# Patient Record
Sex: Male | Born: 1950 | Race: White | Hispanic: No | Marital: Single | Smoking: Former smoker
Health system: Southern US, Community
[De-identification: ages and names within clinical notes are randomized; demographics above are authoritative.]

## PROBLEM LIST (undated history)

## (undated) DIAGNOSIS — I10 Essential (primary) hypertension: Secondary | ICD-10-CM

## (undated) DIAGNOSIS — E119 Type 2 diabetes mellitus without complications: Secondary | ICD-10-CM

## (undated) DIAGNOSIS — R748 Abnormal levels of other serum enzymes: Principal | ICD-10-CM

## (undated) DIAGNOSIS — K562 Volvulus: Secondary | ICD-10-CM

## (undated) DIAGNOSIS — E785 Hyperlipidemia, unspecified: Secondary | ICD-10-CM

## (undated) DIAGNOSIS — Z87442 Personal history of urinary calculi: Secondary | ICD-10-CM

## (undated) DIAGNOSIS — J302 Other seasonal allergic rhinitis: Secondary | ICD-10-CM

## (undated) DIAGNOSIS — C801 Malignant (primary) neoplasm, unspecified: Secondary | ICD-10-CM

## (undated) HISTORY — DX: Hyperlipidemia, unspecified: E78.5

## (undated) HISTORY — DX: Other seasonal allergic rhinitis: J30.2

## (undated) HISTORY — DX: Type 2 diabetes mellitus without complications: E11.9

## (undated) HISTORY — PX: KIDNEY STONE SURGERY: SHX686

## (undated) HISTORY — DX: Abnormal levels of other serum enzymes: R74.8

## (undated) HISTORY — DX: Essential (primary) hypertension: I10

## (undated) HISTORY — DX: Malignant (primary) neoplasm, unspecified: C80.1

---
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
## 2001-12-15 ENCOUNTER — Emergency Department (HOSPITAL_COMMUNITY): Admission: EM | Admit: 2001-12-15 | Discharge: 2001-12-16 | Payer: Self-pay | Admitting: Emergency Medicine

## 2001-12-16 ENCOUNTER — Encounter: Payer: Self-pay | Admitting: Emergency Medicine

## 2003-08-11 ENCOUNTER — Emergency Department (HOSPITAL_COMMUNITY): Admission: EM | Admit: 2003-08-11 | Discharge: 2003-08-11 | Payer: Self-pay | Admitting: *Deleted

## 2003-08-19 ENCOUNTER — Ambulatory Visit (HOSPITAL_BASED_OUTPATIENT_CLINIC_OR_DEPARTMENT_OTHER): Admission: RE | Admit: 2003-08-19 | Discharge: 2003-08-19 | Payer: Self-pay | Admitting: Urology

## 2003-08-19 ENCOUNTER — Ambulatory Visit (HOSPITAL_COMMUNITY): Admission: RE | Admit: 2003-08-19 | Discharge: 2003-08-19 | Payer: Self-pay | Admitting: Urology

## 2008-03-18 ENCOUNTER — Ambulatory Visit: Admission: RE | Admit: 2008-03-18 | Discharge: 2008-04-21 | Payer: Self-pay | Admitting: Radiation Oncology

## 2008-04-24 DIAGNOSIS — C801 Malignant (primary) neoplasm, unspecified: Secondary | ICD-10-CM

## 2008-04-24 HISTORY — DX: Malignant (primary) neoplasm, unspecified: C80.1

## 2008-04-24 HISTORY — PX: PROSTATECTOMY: SHX69

## 2008-06-22 ENCOUNTER — Inpatient Hospital Stay (HOSPITAL_COMMUNITY): Admission: RE | Admit: 2008-06-22 | Discharge: 2008-06-23 | Payer: Self-pay | Admitting: Urology

## 2008-06-22 ENCOUNTER — Encounter (INDEPENDENT_AMBULATORY_CARE_PROVIDER_SITE_OTHER): Payer: Self-pay | Admitting: Urology

## 2010-06-06 ENCOUNTER — Encounter: Payer: Self-pay | Admitting: Internal Medicine

## 2010-08-04 LAB — HEMOGLOBIN AND HEMATOCRIT, BLOOD
HCT: 37.7 % — ABNORMAL LOW (ref 39.0–52.0)
HCT: 41.4 % (ref 39.0–52.0)
Hemoglobin: 13.2 g/dL (ref 13.0–17.0)
Hemoglobin: 14 g/dL (ref 13.0–17.0)

## 2010-08-04 LAB — TYPE AND SCREEN
ABO/RH(D): A POS
Antibody Screen: NEGATIVE

## 2010-08-09 LAB — BASIC METABOLIC PANEL
CO2: 28 mEq/L (ref 19–32)
Calcium: 9.5 mg/dL (ref 8.4–10.5)
Creatinine, Ser: 0.92 mg/dL (ref 0.4–1.5)
GFR calc Af Amer: >60 mL/min (ref >60)
GFR calc non Af Amer: >60 mL/min (ref >60)
Sodium: 137 mEq/L (ref 135–145)

## 2010-08-09 LAB — CBC
Hemoglobin: 14.4 g/dL (ref 13.0–17.0)
MCHC: 33.4 g/dL (ref 30.0–36.0)
RBC: 4.79 MIL/uL (ref 4.22–5.81)
WBC: 5.5 10*3/uL (ref 4.0–10.5)

## 2010-09-06 NOTE — Op Note (Signed)
NAMEJERRICO, COVELLO                 ACCOUNT NO.:  [id number]   MEDICAL RECORD NO.:  [id number]          PATIENT TYPE:  INP   LOCATION:  1442                         FACILITY:  [facility]   PHYSICIAN:  Heloise Purpura, MD      DATE OF BIRTH:  [birth year]   DATE OF PROCEDURE:  06/22/2008  DATE OF DISCHARGE:                               OPERATIVE REPORT   PREOPERATIVE DIAGNOSIS:  Clinically localized adenocarcinoma of prostate  (clinical stage T2A, NX, MX).   POSTOPERATIVE DIAGNOSIS:  Clinically localized adenocarcinoma of  prostate (clinical stage T2A, NX, MX).   PROCEDURE:  Robotic assisted laparoscopic radical prostatectomy  (bilateral nerve sparing).   SURGEON:  Heloise Purpura, MD   ASSISTANT:  Delia Chimes, nurse practitioner.   ANESTHESIA:  General.   COMPLICATIONS:  None.   ESTIMATED BLOOD LOSS:  100 mL.   SPECIMENS:  Prostate and seminal vesicles.   DISPOSITION OF SPECIMENS:  To pathology.   DRAINS:  1. A 20-French coude catheter.  2. #19 Blake pelvic drain.   INDICATION:  Mr. Salido is a 60 year old gentleman with clinically  localized adenocarcinoma of the prostate.  After a discussion regarding  management options for treatment, he elected to proceed with surgical  therapy and the above procedure.  The potential risks, complications,  and alternative treatment options were discussed in detail and informed  consent was obtained.   DESCRIPTION OF PROCEDURE:  The patient was taken to the operating room  and a general anesthetic was administered.  He was given preoperative  antibiotics, placed in the dorsal lithotomy position, and prepped and  draped in the usual sterile fashion.  Next preoperative time-out was  performed.  A Foley catheter was inserted into the bladder and a site  was selected just to the left of the umbilicus for placement of the  camera port.  This was placed using a standard open Hasson technique  which allowed entry into the peritoneal cavity under  direct vision  without difficulty.  A 12 mm port was then placed and a pneumoperitoneum  established.  The 0 degrees lens was used to inspect the abdomen and  there was no evidence for any intra-abdominal injuries or other  abnormalities.  The remaining ports were then placed.  8 mm robotic  ports were placed 10 cm lateral to and just inferior to the camera port  site bilaterally.  An additional 8 mm robotic port was placed in the far  left lateral abdominal wall.  A 5 mm port was placed between the camera  port and the right robotic port.  An additional 12 mm port was placed in  the far right lateral abdominal wall for laparoscopic assistance.  All  ports were placed under direct vision without difficulty.  The surgical  cart was then docked.  With the aid of the cautery scissors, the bladder  was reflected posteriorly allowing entry into space of Retzius and  identification of the endopelvic fascia and prostate.  The endopelvic  fascia was then incised from the apex back to the base of the prostate  bilaterally and the  underlying levator muscle fibers were swept  laterally off the prostate thereby isolating the dorsal venous complex.  The dorsal vein was then stapled and divided with a 45 mm Flex ETS  stapler.  The bladder neck was identified with the aid of Foley catheter  manipulation and divided anteriorly thereby exposing the Foley catheter.  The Foley catheter balloon was deflated and the catheter was brought  into the operative field and used to retract the prostate anteriorly.  The posterior bladder neck was examined and was divided.  Dissection  proceeded between the bladder and prostate until the vasa deferentia and  seminal vesicles were identified.  The vasa deferentia were isolated,  divided and lifted anteriorly.  The seminal vesicles were then dissected  down to their tips with care to control the seminal vesicle arterial  blood supply.  The seminal vesicles were then  lifted anteriorly and the  space between Denonvilliers fascia and the anterior rectum was bluntly  developed thereby isolating the vascular pedicles of the prostate.  The  lateral prostatic fascia was incised bilaterally and the neurovascular  bundles were released.  The vascular pedicles of the prostate were then  ligated with Hem-o-lok clips above the level of the neurovascular  bundles and this tissue was divided with sharp cold scissor dissection,  allowing the neurovascular bundles to be swept off the apex of the  prostate and urethra.  Urethra was then sharply divided allowing the  specimen to be disarticulated.  The pelvis was copiously irrigated and  hemostasis was ensured.  Attention then turned to the urethral  anastomosis.  A  2-0 Vicryl slip-knot was placed between Denonvilliers  fascia, the posterior bladder neck, and the posterior urethra to  reapproximate these structures.  A double-armed 3-0 Monocryl suture was  then used to perform a 360 degrees running tension-free anastomosis  between the bladder neck and urethra.  A new 20-French coude catheter  was inserted into the bladder and irrigated.  There were no blood clots  within the bladder and anastomosis appeared to be watertight.  A  #19  Blake pelvic drain was then brought through the left robotic port and  appropriately positioned in the pelvis.  It was secured to the skin with  a nylon suture.  The surgical cart was then undocked.  The right lateral  12 mm port site was closed with a 0 Vicryl suture placed with the aid of  the suture passer device.  The prostate specimen was then removed intact  within the Endopouch retrieval bag via the periumbilical port site.  This port site was then closed with a running 0 Vicryl suture.  All port  sites were injected with 0.25% Marcaine in no acute distress  reapproximated at the skin level with staples.  Sterile dressings were  applied.  The patient appeared to tolerate the  procedure well without  complications.  He was able to be extubated and transferred to recovery  in satisfactory condition.      Heloise Purpura, MD  Electronically Signed     LB/MEDQ  D:  06/22/2008  T:  06/22/2008  Job:  [id number]

## 2010-09-09 NOTE — Op Note (Signed)
NAME:  Donald Hanson, Donald Hanson                           ACCOUNT NO.:  [id number]   MEDICAL RECORD NO.:  [id number]                   PATIENT TYPE:  AMB   LOCATION:  [facility]                                 FACILITY:  [facility]   PHYSICIAN:  Excell Seltzer. Annabell Howells, M.D.                 DATE OF BIRTH:  1950/04/30   DATE OF PROCEDURE:  08/19/2003  DATE OF DISCHARGE:                                 OPERATIVE REPORT   OPERATION/PROCEDURE:  1. Cystoscopy.  2. Right ureteroscopic stone extraction.   PREOPERATIVE DIAGNOSIS:  Right distal ureteral stone.   POSTOPERATIVE DIAGNOSIS:  Right distal ureteral stone.   SURGEON:  Excell Seltzer. Annabell Howells, M.D.   ANESTHESIA:  General.   SPECIMENS:  Stone fragments.   COMPLICATIONS:  None.   INDICATIONS:  [name] is a 60 year old white male with a symptomatic 5 mm  right distal ureteral stone who has elected ureteroscopic stone extraction.   FINDINGS AND PROCEDURE:  The patient was given p.o. Cipro.  He was taken to  the operating room where general anesthesia was induced.  He was placed in  the lithotomy position.  A B&O suppository was placed.  His genitalia was  prepped with Betadine solution and he was draped in the usual sterile  fashion.  Cystoscopy was performed using the 22-French scope and the 12- and  70-degree lenses.  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  It was  easily passed.  The external sphincter was otherwise intact.  The prostatic  urethra had trilobar hyperplasia with mild obstruction.  Examination of the  bladder revealed mild trabeculation.  No tumor, stones or inflammation were  noted.  Ureteral orifices were unremarkable with the exception of the stone  being visible through the right ureteral orifice.  The cystoscope was  removed.  A 6-French short ureteroscope was then passed.  A guidewire was  passed through the ureteroscope up the right ureteral orifice.  The  scope  was then advanced up the orifice and the wire was removed.  The stone was  visualized, grasped with a nitinol basket and removed.  Initially a small  fragment came out.  I looked back and got the body of the stone.  At this  point the ureteroscope was removed.  The cystoscope was replaced.  The  guidewire was positioned in the kidney under fluoroscopic guidance and a 6-  French x 24 cm double-J stent with string was placed without difficulty.  The wire was removed leaving good coil in the kidney and good coil in the  bladder.  The bladder was drained and the scope was removed leaving the  stent string exiting the urethra.  Final position check with fluoroscopy was  good.  At this point the patient was taken down from the lithotomy position,  his anesthetic was reversed.  He was  admitted to the recovery room in stable  condition.  There were no complications.                                               Excell Seltzer. Annabell Howells, M.D.    JJW/MEDQ  D:  08/19/2003  T:  08/19/2003  Job:  [id number]

## 2010-10-17 NOTE — Letter (Signed)
Summary: Colonoscopy Date Change Letter  [facility]  [address]   [place], [place] [address]   Phone: [phone]  Fax: [phone]      June 06, 2010 MRN: [id number]   Donald Hanson [address] [place], [place]  [address]   Dear [name],   Previously you were recommended to have a repeat colonoscopy around this time. Your chart was recently reviewed by Dr. Leone Payor of [facility]. Follow up colonoscopy is now recommended in February 2015. This revised recommendation is based on current, nationally recognized guidelines for colorectal cancer screening and polyp surveillance. These guidelines are endorsed by the American Cancer Society, [employer] on Colorectal Cancer as well as numerous other major medical organizations.  Please understand that our recommendation assumes that you do not have any new symptoms such as bleeding, a change in bowel habits, anemia, or significant abdominal discomfort. If you do have any concerning GI symptoms or want to discuss the guideline recommendations, please call to arrange an office visit at your earliest convenience. Otherwise we will keep you in our reminder system and contact you 1-2 months prior to the date listed above to schedule your next colonoscopy.  Thank you,   Stan Head, M.D.  [facility] [phone]

## 2013-05-26 ENCOUNTER — Encounter: Payer: Self-pay | Admitting: Internal Medicine

## 2013-05-30 ENCOUNTER — Ambulatory Visit (AMBULATORY_SURGERY_CENTER): Payer: Self-pay | Admitting: *Deleted

## 2013-05-30 VITALS — Ht 70.0 in | Wt 214.0 lb

## 2013-05-30 DIAGNOSIS — Z1211 Encounter for screening for malignant neoplasm of colon: Secondary | ICD-10-CM

## 2013-05-30 MED ORDER — NA SULFATE-K SULFATE-MG SULF 17.5-3.13-1.6 GM/177ML PO SOLN: 1.0000 | Freq: Once | ORAL | Status: DC

## 2013-05-30 NOTE — Progress Notes (Signed)
No allergies to eggs or soy. No problems with anesthesia.  

## 2013-06-04 ENCOUNTER — Encounter: Payer: Self-pay | Admitting: Internal Medicine

## 2013-06-09 ENCOUNTER — Telehealth: Payer: Self-pay | Admitting: Internal Medicine

## 2013-06-10 NOTE — Telephone Encounter (Signed)
No charge. 

## 2013-06-11 ENCOUNTER — Encounter: Payer: Self-pay | Admitting: Internal Medicine

## 2013-06-27 ENCOUNTER — Emergency Department (HOSPITAL_COMMUNITY): Payer: 59

## 2013-06-27 ENCOUNTER — Encounter (HOSPITAL_COMMUNITY)
Admission: EM | Disposition: A | Discharge status: Home / Self Care | Payer: 59 | Source: Home / Self Care | Attending: Internal Medicine

## 2013-06-27 ENCOUNTER — Inpatient Hospital Stay (HOSPITAL_COMMUNITY)
Admission: EM | Admit: 2013-06-27 | Discharge: 2013-06-30 | DRG: 390 | Disposition: A | Discharge status: Home / Self Care | Payer: 59 | Attending: Internal Medicine | Admitting: Internal Medicine

## 2013-06-27 ENCOUNTER — Observation Stay (HOSPITAL_COMMUNITY): Payer: 59

## 2013-06-27 ENCOUNTER — Encounter (HOSPITAL_COMMUNITY): Payer: Self-pay | Admitting: Internal Medicine

## 2013-06-27 ENCOUNTER — Inpatient Hospital Stay (HOSPITAL_COMMUNITY): Payer: 59

## 2013-06-27 DIAGNOSIS — E1165 Type 2 diabetes mellitus with hyperglycemia: Secondary | ICD-10-CM | POA: Diagnosis present

## 2013-06-27 DIAGNOSIS — IMO0002 Reserved for concepts with insufficient information to code with codable children: Secondary | ICD-10-CM | POA: Diagnosis present

## 2013-06-27 DIAGNOSIS — R109 Unspecified abdominal pain: Secondary | ICD-10-CM | POA: Insufficient documentation

## 2013-06-27 DIAGNOSIS — E118 Type 2 diabetes mellitus with unspecified complications: Secondary | ICD-10-CM

## 2013-06-27 DIAGNOSIS — Z8546 Personal history of malignant neoplasm of prostate: Secondary | ICD-10-CM

## 2013-06-27 DIAGNOSIS — K562 Volvulus: Principal | ICD-10-CM | POA: Diagnosis present

## 2013-06-27 DIAGNOSIS — IMO0001 Reserved for inherently not codable concepts without codable children: Secondary | ICD-10-CM | POA: Diagnosis present

## 2013-06-27 DIAGNOSIS — I10 Essential (primary) hypertension: Secondary | ICD-10-CM | POA: Diagnosis present

## 2013-06-27 DIAGNOSIS — K7689 Other specified diseases of liver: Secondary | ICD-10-CM | POA: Diagnosis present

## 2013-06-27 DIAGNOSIS — R933 Abnormal findings on diagnostic imaging of other parts of digestive tract: Secondary | ICD-10-CM

## 2013-06-27 DIAGNOSIS — Z87891 Personal history of nicotine dependence: Secondary | ICD-10-CM

## 2013-06-27 DIAGNOSIS — E785 Hyperlipidemia, unspecified: Secondary | ICD-10-CM | POA: Diagnosis present

## 2013-06-27 DIAGNOSIS — Z79899 Other long term (current) drug therapy: Secondary | ICD-10-CM

## 2013-06-27 DIAGNOSIS — R040 Epistaxis: Secondary | ICD-10-CM | POA: Diagnosis not present

## 2013-06-27 HISTORY — DX: Personal history of urinary calculi: Z87.442

## 2013-06-27 HISTORY — PX: COLONOSCOPY: SHX5424

## 2013-06-27 LAB — COMPREHENSIVE METABOLIC PANEL
ALBUMIN: 4.3 g/dL (ref 3.5–5.2)
ALT: 56 U/L — ABNORMAL HIGH (ref 0–53)
AST: 41 U/L — AB (ref 0–37)
Alkaline Phosphatase: 59 U/L (ref 39–117)
BILIRUBIN TOTAL: 0.6 mg/dL (ref 0.3–1.2)
BUN: 18 mg/dL (ref 6–23)
CHLORIDE: 102 meq/L (ref 96–112)
CO2: 24 mEq/L (ref 19–32)
CREATININE: 1.01 mg/dL (ref 0.50–1.35)
Calcium: 10.1 mg/dL (ref 8.4–10.5)
GFR calc Af Amer: >90 mL/min (ref >90)
GFR calc non Af Amer: 78 mL/min — ABNORMAL LOW (ref >90)
Glucose, Bld: 157 mg/dL — ABNORMAL HIGH (ref 70–99)
Potassium: 4.5 mEq/L (ref 3.7–5.3)
Sodium: 139 mEq/L (ref 137–147)
TOTAL PROTEIN: 6.7 g/dL (ref 6.0–8.3)

## 2013-06-27 LAB — CBC
HEMATOCRIT: 43.6 % (ref 39.0–52.0)
Hemoglobin: 14.9 g/dL (ref 13.0–17.0)
MCH: 29.9 pg (ref 26.0–34.0)
MCHC: 34.2 g/dL (ref 30.0–36.0)
MCV: 87.4 fL (ref 78.0–100.0)
PLATELETS: 230 10*3/uL (ref 150–400)
RBC: 4.99 MIL/uL (ref 4.22–5.81)
RDW: 13.2 % (ref 11.5–15.5)
WBC: 8.6 10*3/uL (ref 4.0–10.5)

## 2013-06-27 LAB — GLUCOSE, CAPILLARY
Glucose-Capillary: 126 mg/dL — ABNORMAL HIGH (ref 70–99)
Glucose-Capillary: 96 mg/dL (ref 70–99)
Glucose-Capillary: 115 mg/dL — ABNORMAL HIGH (ref 70–99)

## 2013-06-27 LAB — I-STAT CG4 LACTIC ACID, ED: LACTIC ACID, VENOUS: 0.87 mmol/L (ref 0.5–2.2)

## 2013-06-27 LAB — URINALYSIS, ROUTINE W REFLEX MICROSCOPIC
Bilirubin Urine: NEGATIVE
Glucose, UA: 100 mg/dL — AB
HGB URINE DIPSTICK: NEGATIVE
Ketones, ur: NEGATIVE mg/dL
LEUKOCYTES UA: NEGATIVE
NITRITE: NEGATIVE
Protein, ur: NEGATIVE mg/dL
SPECIFIC GRAVITY, URINE: 1.025 (ref 1.005–1.030)
UROBILINOGEN UA: 0.2 mg/dL (ref 0.0–1.0)
pH: 5.5 (ref 5.0–8.0)

## 2013-06-27 LAB — CREATININE, SERUM
Creatinine, Ser: 0.97 mg/dL (ref 0.50–1.35)
GFR calc non Af Amer: 87 mL/min — ABNORMAL LOW (ref >90)

## 2013-06-27 LAB — CBC WITH DIFFERENTIAL/PLATELET
BASOS ABS: 0 10*3/uL (ref 0.0–0.1)
BASOS PCT: 1 % (ref 0–1)
Eosinophils Absolute: 0.2 10*3/uL (ref 0.0–0.7)
Eosinophils Relative: 3 % (ref 0–5)
HEMATOCRIT: 41.9 % (ref 39.0–52.0)
Hemoglobin: 14.5 g/dL (ref 13.0–17.0)
Lymphocytes Relative: 30 % (ref 12–46)
Lymphs Abs: 1.8 10*3/uL (ref 0.7–4.0)
MCH: 30 pg (ref 26.0–34.0)
MCHC: 34.6 g/dL (ref 30.0–36.0)
MCV: 86.7 fL (ref 78.0–100.0)
MONO ABS: 1 10*3/uL (ref 0.1–1.0)
Monocytes Relative: 17 % — ABNORMAL HIGH (ref 3–12)
NEUTROS ABS: 3 10*3/uL (ref 1.7–7.7)
NEUTROS PCT: 49 % (ref 43–77)
Platelets: 245 10*3/uL (ref 150–400)
RBC: 4.83 MIL/uL (ref 4.22–5.81)
RDW: 13.1 % (ref 11.5–15.5)
WBC: 6.1 10*3/uL (ref 4.0–10.5)

## 2013-06-27 LAB — LACTATE DEHYDROGENASE: LDH: 204 U/L (ref 94–250)

## 2013-06-27 LAB — LIPASE, BLOOD: Lipase: 263 U/L — ABNORMAL HIGH (ref 11–59)

## 2013-06-27 LAB — I-STAT TROPONIN, ED: TROPONIN I, POC: 0 ng/mL (ref 0.00–0.08)

## 2013-06-27 LAB — HEMOGLOBIN A1C
Hgb A1c MFr Bld: 7.7 % — ABNORMAL HIGH (ref <5.7)
Mean Plasma Glucose: 174 mg/dL — ABNORMAL HIGH (ref <117)

## 2013-06-27 SURGERY — COLONOSCOPY
Anesthesia: Moderate Sedation

## 2013-06-27 MED ORDER — MIDAZOLAM HCL 5 MG/5ML IJ SOLN
INTRAMUSCULAR | Status: DC | PRN
  Administered 2013-06-27: 2 mg via INTRAVENOUS
  Administered 2013-06-27: 1 mg via INTRAVENOUS
  Administered 2013-06-27: 2 mg via INTRAVENOUS

## 2013-06-27 MED ORDER — IOHEXOL 300 MG/ML  SOLN
100.0000 mL | Freq: Once | INTRAMUSCULAR | Status: AC | PRN
  Administered 2013-06-27: 100 mL via INTRAVENOUS

## 2013-06-27 MED ORDER — ONDANSETRON HCL 4 MG PO TABS
4.0000 mg | ORAL_TABLET | Freq: Four times a day (QID) | ORAL | Status: DC | PRN
Start: 2013-06-27 — End: 2013-06-30

## 2013-06-27 MED ORDER — SODIUM CHLORIDE 0.9 % IV SOLN: INTRAVENOUS | Status: DC

## 2013-06-27 MED ORDER — HEPARIN SODIUM (PORCINE) 5000 UNIT/ML IJ SOLN
5000.0000 [IU] | Freq: Three times a day (TID) | INTRAMUSCULAR | Status: DC
  Administered 2013-06-28: 5000 [IU] via SUBCUTANEOUS
  Filled 2013-06-27 (×12): qty 1

## 2013-06-27 MED ORDER — MIDAZOLAM HCL 5 MG/ML IJ SOLN
INTRAMUSCULAR | Status: AC
  Filled 2013-06-27: qty 2

## 2013-06-27 MED ORDER — INSULIN ASPART 100 UNIT/ML ~~LOC~~ SOLN
0.0000 [IU] | Freq: Three times a day (TID) | SUBCUTANEOUS | Status: DC
  Administered 2013-06-28 – 2013-06-29 (×2): 1 [IU] via SUBCUTANEOUS
  Administered 2013-06-29: 2 [IU] via SUBCUTANEOUS
  Administered 2013-06-30: 1 [IU] via SUBCUTANEOUS

## 2013-06-27 MED ORDER — FENTANYL CITRATE 0.05 MG/ML IJ SOLN
INTRAMUSCULAR | Status: DC | PRN
  Administered 2013-06-27 (×3): 25 ug via INTRAVENOUS

## 2013-06-27 MED ORDER — HYDROMORPHONE HCL PF 1 MG/ML IJ SOLN
1.0000 mg | Freq: Once | INTRAMUSCULAR | Status: AC
  Administered 2013-06-27: 1 mg via INTRAVENOUS
  Filled 2013-06-27: qty 1

## 2013-06-27 MED ORDER — ACETAMINOPHEN 650 MG RE SUPP: 650.0000 mg | Freq: Four times a day (QID) | RECTAL | Status: DC | PRN

## 2013-06-27 MED ORDER — ONDANSETRON HCL 4 MG/2ML IJ SOLN
4.0000 mg | Freq: Once | INTRAMUSCULAR | Status: AC
  Administered 2013-06-27: 4 mg via INTRAVENOUS
  Filled 2013-06-27: qty 2

## 2013-06-27 MED ORDER — HYDROMORPHONE HCL PF 1 MG/ML IJ SOLN
1.0000 mg | INTRAMUSCULAR | Status: DC | PRN
  Administered 2013-06-27 – 2013-06-28 (×5): 1 mg via INTRAVENOUS
  Filled 2013-06-27 (×5): qty 1

## 2013-06-27 MED ORDER — ONDANSETRON HCL 4 MG/2ML IJ SOLN: 4.0000 mg | Freq: Four times a day (QID) | INTRAMUSCULAR | Status: DC | PRN

## 2013-06-27 MED ORDER — IOHEXOL 300 MG/ML  SOLN
450.0000 mL | Freq: Once | INTRAMUSCULAR | Status: AC | PRN
  Administered 2013-06-27: 450 mL

## 2013-06-27 MED ORDER — FENTANYL CITRATE 0.05 MG/ML IJ SOLN
INTRAMUSCULAR | Status: AC
  Filled 2013-06-27: qty 2

## 2013-06-27 MED ORDER — HYDRALAZINE HCL 20 MG/ML IJ SOLN: 5.0000 mg | Freq: Four times a day (QID) | INTRAMUSCULAR | Status: DC | PRN

## 2013-06-27 MED ORDER — ACETAMINOPHEN 325 MG PO TABS
650.0000 mg | ORAL_TABLET | Freq: Four times a day (QID) | ORAL | Status: DC | PRN
  Administered 2013-06-27: 650 mg via ORAL
  Filled 2013-06-27: qty 2

## 2013-06-27 MED ORDER — POTASSIUM CHLORIDE IN NACL 20-0.45 MEQ/L-% IV SOLN
INTRAVENOUS | Status: DC
  Administered 2013-06-27 – 2013-06-29 (×4): via INTRAVENOUS
  Filled 2013-06-27 (×6): qty 1000

## 2013-06-27 MED ORDER — INSULIN DETEMIR 100 UNIT/ML ~~LOC~~ SOLN
10.0000 [IU] | Freq: Every day | SUBCUTANEOUS | Status: DC
  Administered 2013-06-28 – 2013-06-29 (×2): 10 [IU] via SUBCUTANEOUS
  Filled 2013-06-27 (×4): qty 0.1

## 2013-06-27 NOTE — ED Notes (Signed)
Updated family waiting on bed placement

## 2013-06-27 NOTE — Progress Notes (Signed)
Donald Hanson [id number] Code Status: not on file at this time Admission Data: 06/27/2013 12:41 PM Attending Provider:  Aileen Fass  MVH:QIONGEXB,MWUXL F, MD Consults/ Treatment Team: [facility]  Donald Hanson is a 63 y.o. male patient admitted from ED awake, alert - oriented  X 3 - no acute distress noted.  VSS - Blood pressure 162/92, pulse 78, temperature 98 F (36.7 C), temperature source Oral, resp. rate 16, height 5\' 10"  (1.778 m), weight 94.394 kg (208 lb 1.6 oz), SpO2 93.00%.  no c/o shortness of breath, no c/o chest pain.  IV Fluids:  IV in place, occlusive dsg intact without redness, IV cath antecubital left, condition patent and no redness none.  Allergies:  No Known Allergies   Past Medical History  Diagnosis Date  . Diabetes   . Hypertension   . Hyperlipidemia   . Seasonal allergies   . Cancer 2010    prostate cancer   Medications Prior to Admission  Medication Sig Dispense Refill  . atorvastatin (LIPITOR) 80 MG tablet Take 80 mg by mouth daily.      Marland Kitchen glipiZIDE (GLUCOTROL XL) 5 MG 24 hr tablet Take 5 mg by mouth 2 (two) times daily.      Marland Kitchen HYDROcodone-acetaminophen (NORCO) 10-325 MG per tablet Take 1 tablet by mouth every 6 (six) hours as needed for moderate pain or severe pain.      Marland Kitchen lisinopril (PRINIVIL,ZESTRIL) 40 MG tablet Take 1 tablet by mouth daily.      . metFORMIN (GLUMETZA) 500 MG (MOD) 24 hr tablet Take 500 mg by mouth 2 (two) times daily with a meal. Takes 2 tablets twice daily      . Omega-3 Fatty Acids (FISH OIL) 1000 MG CPDR Take by mouth 2 (two) times daily.      . sitaGLIPtin (JANUVIA) 100 MG tablet Take 100 mg by mouth daily.      . Vitamin D, Cholecalciferol, 1000 UNITS TABS Take by mouth 2 (two) times daily.       History:  obtained from the patient. Tobacco/alcohol: denied   Orientation to room, and floor completed with information packet given to patient/family.  Patient declined safety video at this time.  Admission INP armband ID  verified with patient/family, and in place.   SR up x 2, fall assessment complete, with patient and family able to verbalize understanding of risk associated with falls, and verbalized understanding to call nsg before up out of bed.  Call light within reach, patient able to voice, and demonstrate understanding.  Skin, clean-dry- intact without evidence of bruising, or skin tears.   No evidence of skin break down noted on exam.    MD notified of patient's arrival to unit.   Will cont to eval and treat per MD orders.  Delman Cheadle, RN 06/27/2013 12:41 PM

## 2013-06-27 NOTE — ED Provider Notes (Signed)
Medical screening examination/treatment/procedure(s) were performed by non-physician practitioner and as supervising physician I was immediately available for consultation/collaboration.   EKG Interpretation   Date/Time:  Friday June 27 2013 00:34:17 EST Ventricular Rate:  80 PR Interval:  134 QRS Duration: 87 QT Interval:  342 QTC Calculation: 394 R Axis:   67 Text Interpretation:  Sinus rhythm Nonspecific ST abnormality No  significant change since last tracing Confirmed by OPITZ  MD, BRIAN  [phone]) on 06/27/2013 12:46:46 AM       Threasa Beards, MD 06/27/13 [phone]

## 2013-06-27 NOTE — Progress Notes (Signed)
11:58 AM report received from ED RN for admission to [phone]

## 2013-06-27 NOTE — ED Notes (Signed)
Patient transported to Ultrasound 

## 2013-06-27 NOTE — Consult Note (Signed)
I saw the patient, participated in the history, exam and medical decision making, and concur with the physician assistant's note above.  I have reviewed the patient's imaging and labs and chart.  I met the patient in endoscopy in the recovery area after undergoing Colonoscopic evaluation. I interviewed the patient along with Dr. Excell Seltzer.  The patient states that he developed acute onset of sharp right upper quadrant pain yesterday and had ongoing constant pain since that time. He also felt very bloated and distended. His last bowel movement was yesterday. He states he has had prior colonoscopies. He denies any similar symptoms in the past. He states he feels somewhat better after the colonoscopy. He doesn't feel as bloated or distended.  BP 167/101  Pulse 76  Temp(Src) 97.8 F (36.6 C) (Oral)  Resp 20  Ht _0  (1.778 m)  Wt 208 lb 1.6 oz (94.394 kg)  BMI 29.86 kg/m2  SpO2 96%  Alert, no apparent distress, nontoxic appearing Abdomen is protuberant, mildly tympanic. He has no peritonitis or guarding. His abdomen is soft. He doesn't really have that much tenderness on exam.  He has a post colonoscopy x-ray pending  He appears to have a very redundant sigmoid colon on imaging.  Sigmoid volvulus  Clinically he appears improved. His abdomen is soft, no guarding, and essentially nontender. I do not believe he needs urgent surgical intervention tonight. I would recommend ongoing bowel rest this evening. Wait post colonoscopy imaging. Repeat films in the morning. He may perhaps need another barium enema but wait on post coloscopic imaging. Will re-evaluate pt in am  Leighton Ruff. Redmond Pulling, MD, FACS General, Bariatric, & Minimally Invasive Surgery [facility], [place]

## 2013-06-27 NOTE — ED Notes (Signed)
Sudden onset of ruq pain. Hurts with palpation. Some nausea.

## 2013-06-27 NOTE — Op Note (Signed)
[facility] [place], [address]   COLONOSCOPY PROCEDURE REPORT  PATIENT: Donald Hanson, Donald Hanson  MR#: [id number] BIRTHDATE: [birth year] , 62  yrs. old GENDER: Male ENDOSCOPIST: Eustace Quail, MD REFERRED QP:RFFMBWGY Hoxworth, M.D. PROCEDURE DATE:  06/27/2013 PROCEDURE:   Colonoscopy, therapeutic First Screening Colonoscopy - Avg.  risk and is 50 yrs.  old or older - No.  Prior Negative Screening - Now for repeat screening. N/A  History of Adenoma - Now for follow-up colonoscopy & has been > or = to 3 yrs.  N/A  Polyps Removed Today? No.  Recommend repeat exam, <10 yrs? No. ASA CLASS:   Class II INDICATIONS:an abnormal barium enema and an abnormal CT.   Sigmoid volvulus MEDICATIONS: Fentanyl 50 mcg IV and Versed 5 mg IV  DESCRIPTION OF PROCEDURE:   After the risks benefits and alternatives of the procedure were thoroughly explained, informed consent was obtained.  A digital rectal exam revealed no abnormalities of the rectum.   The Pentax Colonoscope [id number] endoscope was introduced through the anus and advanced to the ascending colon. No adverse events experienced.   The quality of the prep was none  The instrument was then slowly withdrawn as the colon was fully examined.     COLON FINDINGS: The colonoscope was inserted per rectum.  Minimal air insufflation/CO2 used.  There appeared to be some tortuosity to the sigmoid colon, but no classic pinpoint transition point.  The colonoscope was advanced to the distal right colon where formed stool was encountered.  There was suctioned throughout all examined portions.  Despite this, the patient remained distended post procedure, though somewhat better.  Retroflexion was not performed. The time to cecum=minutes 0 seconds.  Withdrawal time=minutes 0 seconds.  The scope was withdrawn and the procedure completed. COMPLICATIONS: There were no complications.  ENDOSCOPIC IMPRESSION: 1. Probable  sigmoid volvulus based on radiology, but somewhat atypical at endoscopy. Despite advancing the scope to the right colon, and decompressing all examined portions, patient remained distended .  RECOMMENDATIONS: 1. Post colonoscopy x-ray to assess results 2. May need general Surgery if bowel distention persists. Results communicated with surgery.   eSigned:  Eustace Quail, MD 06/27/2013 4:06 PM   cc: Excell Seltzer, MD and The Patient

## 2013-06-27 NOTE — ED Notes (Signed)
Updated pt and family , waiting on results of ct scan

## 2013-06-27 NOTE — Progress Notes (Signed)
Discussed with patient his refusal for Heparin injection and Levemir. Educated patient that while he does not take insulin at home his requirements may be different while hospitalized. He verbalized understanding of this and stated that he still did not want to take it. Discussed with him his increased risk of blood clot due to decreased mobility and illness. He verbalized understanding of this and also stated he still did not want to take Heparin. Patient stated "I'll get up and walk around." Will continue to monitor.

## 2013-06-27 NOTE — Progress Notes (Signed)
Barium enema worrisome for sigmoid volvulus. Volvulus was not resolved by the study. Will set up for colonoscopy/decompression.   Azucena Freed  GI ATTENDING  History, laboratories, x-rays reviewed. Patient personally seen and examined. Agree with H&P as above. Patient presents with probable sigmoid volvulus. Sent for barium study for diagnostic and therapeutic purposes. Contrast study confirms sigmoid volvulus. However no reduction. Thus, for urgent colonoscopy in hopes of reducing the volvulus. If this is unsuccessful, then surgical intervention needed.The nature of the procedure, as well as the risks, benefits, and alternatives were carefully and thoroughly reviewed with the patient. Ample time for discussion and questions allowed. The patient understood, was satisfied, and agreed to proceed.  Docia Chuck. Geri Seminole., M.D. [facility] Division of Gastroenterology

## 2013-06-27 NOTE — H&P (Signed)
[facility] History and Physical  Donald Hanson:[id number] DOB: [birth year] DOA: 06/27/2013  Referring physician: Dr. Canary Brim PCP: Marylene Land, MD   Chief Complaint: abdominal pain  HPI: Donald Hanson is a 63 y.o. male  Past medical history of prostate cancer status post surgery in 2010, with a scheduled colonoscopy this month his previous colonoscopy was more than 10 years ago that comes in day prior to admission he started having right upper quadrant abdominal pain with abdominal distention. He he relates progressively getting worst. He complains of nausea but no vomiting. He said he has some relief at home with some Vicodin. His last bowel movement was the day prior to admission.  In the ED: Complete metabolic panel was done initial elevation in AST and ALT were minimally elevated lipase, LDH was 204 lactic acid was 0.8 CBC showed no leukocytosis. CT scan of the abdomen and pelvis showed a sigmoid volvulus.  Review of Systems:  Constitutional:  No weight loss, night sweats, Fevers, chills, fatigue.  HEENT:  No headaches, Difficulty swallowing,Tooth/dental problems,Sore throat,  No sneezing, itching, ear ache, nasal congestion, post nasal drip,  Cardio-vascular:  No chest pain, Orthopnea, PND, swelling in lower extremities, anasarca, dizziness, palpitations  Resp:  No shortness of breath with exertion or at rest. No excess mucus, no productive cough, No non-productive cough, No coughing up of blood.No change in color of mucus.No wheezing.No chest wall deformity  Skin:  no rash or lesions.  GU:  no dysuria, change in color of urine, no urgency or frequency. No flank pain.  Musculoskeletal:  No joint pain or swelling. No decreased range of motion. No back pain.  Psych:  No change in mood or affect. No depression or anxiety. No memory loss.   Past Medical History  Diagnosis Date  . Diabetes   . Hypertension   . Hyperlipidemia   . Seasonal allergies   . Cancer 2010      prostate cancer   Past Surgical History  Procedure Laterality Date  . Prostatectomy  2010  . Nasal fracture  1972   Social History:  reports that he quit smoking about 45 years ago. He has never used smokeless tobacco. He reports that he drinks about 0.6 ounces of alcohol per week. He reports that he does not use illicit drugs.  No Known Allergies  Family History  Problem Relation Age of Onset  . Colon cancer Neg Hx   . Cancer - Ovarian Mother   . Other Father      Prior to Admission medications   Medication Sig Start Date End Date Taking? Authorizing Provider  atorvastatin (LIPITOR) 80 MG tablet Take 80 mg by mouth daily.   Yes Historical Provider, MD  glipiZIDE (GLUCOTROL XL) 5 MG 24 hr tablet Take 5 mg by mouth 2 (two) times daily.   Yes Historical Provider, MD  HYDROcodone-acetaminophen (NORCO) 10-325 MG per tablet Take 1 tablet by mouth every 6 (six) hours as needed for moderate pain or severe pain.   Yes Historical Provider, MD  lisinopril (PRINIVIL,ZESTRIL) 40 MG tablet Take 1 tablet by mouth daily. 05/02/13  Yes Historical Provider, MD  metFORMIN (GLUMETZA) 500 MG (MOD) 24 hr tablet Take 500 mg by mouth 2 (two) times daily with a meal. Takes 2 tablets twice daily   Yes Historical Provider, MD  Omega-3 Fatty Acids (FISH OIL) 1000 MG CPDR Take by mouth 2 (two) times daily.   Yes Historical Provider, MD  sitaGLIPtin (JANUVIA) 100 MG tablet Take 100  mg by mouth daily.   Yes Historical Provider, MD  Vitamin D, Cholecalciferol, 1000 UNITS TABS Take by mouth 2 (two) times daily.   Yes Historical Provider, MD   Physical Exam: Filed Vitals:   06/27/13 1226  BP: 162/92  Pulse: 78  Temp: 98 F (36.7 C)  Resp: 16    BP 162/92  Pulse 78  Temp(Src) 98 F (36.7 C) (Oral)  Resp 16  Ht 5\' 10"  (1.778 m)  Wt 94.394 kg (208 lb 1.6 oz)  BMI 29.86 kg/m2  SpO2 93%  General:  Appears calm and comfortable Eyes: PERRL, normal lids, irises & conjunctiva ENT: grossly normal hearing,  lips & tongue Neck: no LAD, masses or thyromegaly Cardiovascular: RRR, no m/r/g. No LE edema. Respiratory: CTA bilaterally, no w/r/r. Normal respiratory effort. Abdomen: Soft abdominal distention tenderness in the right upper quadrant no rebound or guarding. Skin: no rash or induration seen on limited exam Musculoskeletal: grossly normal tone BUE/BLE Psychiatric: grossly normal mood and affect, speech fluent and appropriate Neurologic: grossly non-focal.          Labs on Admission:  Basic Metabolic Panel:  Recent Labs Lab 06/27/13 0047  NA 139  K 4.5  CL 102  CO2 24  GLUCOSE 157*  BUN 18  CREATININE 1.01  CALCIUM 10.1   Liver Function Tests:  Recent Labs Lab 06/27/13 0047  AST 41*  ALT 56*  ALKPHOS 59  BILITOT 0.6  PROT 6.7  ALBUMIN 4.3    Recent Labs Lab 06/27/13 0047  LIPASE 263*   No results found for this basename: AMMONIA,  in the last 168 hours CBC:  Recent Labs Lab 06/27/13 0047  WBC 6.1  NEUTROABS 3.0  HGB 14.5  HCT 41.9  MCV 86.7  PLT 245   Cardiac Enzymes: No results found for this basename: CKTOTAL, CKMB, CKMBINDEX, TROPONINI,  in the last 168 hours  BNP (last 3 results) No results found for this basename: PROBNP,  in the last 8760 hours CBG:  Recent Labs Lab 06/27/13 1229  GLUCAP 126*    Radiological Exams on Admission: Dg Ribs Unilateral W/chest Right  06/27/2013   CLINICAL DATA:  Chronic cough and congestion for 3 months. Sharp pain anterior right chest. Increases with motion. Hypertension.  EXAM: RIGHT RIBS AND CHEST - 3+ VIEW  COMPARISON:  06/16/2008 chest x-ray.  FINDINGS: No evidence of right-sided rib fracture or pneumothorax.  Poor inspiration. Central pulmonary vascular prominence. Minimal peribronchial thickening. No segmental consolidation.  No plain film evidence of pulmonary malignancy. If there is a persistent unexplained cough followup imaging may be considered.  Cardiomegaly.  Calcified mildly tortuous aorta.   IMPRESSION: No evidence of right-sided rib fracture or pneumothorax.  Please see above.   Electronically Signed   By: Chauncey Cruel M.D.   On: 06/27/2013 02:16   US Abdomen Complete  06/27/2013   CLINICAL DATA:  Chest pain  EXAM: ULTRASOUND ABDOMEN COMPLETE  COMPARISON:  None.  FINDINGS: Gallbladder:  No gallstones or wall thickening visualized. No sonographic Murphy sign noted.  Common bile duct:  Diameter: Not visualized secondary to bowel gas.  Liver:  Poorly delineated secondary to habitus and bowel gas. Findings suggestive of diffuse fatty infiltration.  IVC:  Not visualized secondary to habitus and bowel gas. Not visualized secondary to habitus and bowel gas.  Pancreas:  Visualized portion unremarkable.  Spleen:  Size and appearance within normal limits.  Right Kidney:  Length: 11.6 cm. Echogenicity within normal limits. No mass or hydronephrosis visualized.  Left Kidney:  Length: 12.1 cm. Echogenicity within normal limits. No mass or hydronephrosis visualized.  Abdominal aorta:  Not well delineated secondary to habitus and bowel gas.  IMPRESSION:  No gallstones detected.  Fatty liver suspected.  Poorly delineated pancreas, inferior vena cava, abdominal aorta and portions of liver secondary to habitus and bowel gas   Electronically Signed   By: Chauncey Cruel M.D.   On: 06/27/2013 03:51   Ct Abdomen Pelvis W Contrast  06/27/2013   CLINICAL DATA:  Sudden onset of right upper quadrant abdominal pain.  EXAM: CT ABDOMEN AND PELVIS WITH CONTRAST  TECHNIQUE: Multidetector CT imaging of the abdomen and pelvis was performed using the standard protocol following bolus administration of intravenous contrast.  CONTRAST:  13mL OMNIPAQUE IOHEXOL 300 MG/ML  SOLN  COMPARISON:  Abdominal and 06/27/2013.  FINDINGS: Minimal dependent atelectasis is present bilaterally. The lungs are otherwise clear. The heart size is upper limits of normal coronary artery calcifications are present.  Diffuse fatty infiltration is present in  the liver. No discrete lesions are evident. The spleen is within normal limits. The stomach and duodenum, and pancreas are within normal limits. The common bile duct and gallbladder are normal. The adrenal glands are within normal limits bilaterally. The kidneys and ureters are within normal limits.  The distal sigmoid colon is collapsed. The sigmoid extends into the right upper quadrant where the apex of the loop is dilated and and the more proximal colon is within normal limits. The appendix is visualized and normal.  Atherosclerotic calcifications are present within the abdominal aorta and branch vessels without aneurysm.  The the bone windows are unremarkable.  IMPRESSION: 1. The sigmoid colon stretches to the right upper quadrant and is dilated near its apex concerning for a developing sigmoid volvulus. The sigmoid and is not twisted. 2. No evidence for rupture or free air. 3. Diffuse fatty infiltration of the liver. 4. Atherosclerosis including coronary artery disease.   Electronically Signed   By: Lawrence Santiago M.D.   On: 06/27/2013 07:11   Dg Colon W/cm - Wo/w Kub  06/27/2013   CLINICAL DATA:  Evaluate sigmoid volvulus seen on recent CT abdomen 06/27/2013  EXAM: BE WITH CONTRAST - WITHOUT AND WITH KUB  COMPARISON:  CT ABD/PELVIS W CM dated 06/27/2013  FLUOROSCOPY TIME:  1 min 1 second  FINDINGS: There is contrast filling the sigmoid colon. The contrast column abruptly terminates in the right upper quadrant with no contrast traversing this area. The appearance is concerning for a sigmoid volvulus.  There is no contrast extravasation.  There is no soft tissue mass.  IMPRESSION: Contrast filling the sigmoid colon with the contrast column abruptly terminated in the right upper quadrant with no contrast traversing this area into the more proximal colon. The appearance is concerning for a sigmoid volvulus. Gastroenterology consultation recommended.   Electronically Signed   By: Kathreen Devoid   On: 06/27/2013 12:13     EKG: Independently reviewed. none  Assessment/Plan Sigmoid volvulus - Barium enema was done that showed a persistent volvulus, I have already consulted gastroenterologist Dr. Henrene Pastor. Patient will probably need endoscopy for further decompression. - Place him n.p.o., LDH and lactic acid within normal limits. Repeat tomorrow morning. - Start him on a fluid strict I's and O's hold Blood pressure medication. - Elevation AST and ALT are probably reactive. CT scan did not show stone he is not complaining of pain radiating to his back which was pancreatitis unlikely.  Essential hypertension, benign - Place  n.p.o. hold blood pressure medication [place] hydralazine when necessary.  Type II or unspecified type diabetes mellitus with unspecified complication, uncontrolled - DC her metformin start low dose Lantus plus sliding scale insulin.    Code Status: full Family Communication: wife Disposition Plan: inpatinet  Time spent: 62 minutes  Charlynne Cousins [facility] Pager [phone]

## 2013-06-27 NOTE — ED Notes (Signed)
Patient transported to X-ray 

## 2013-06-27 NOTE — Progress Notes (Signed)
MD notified and aware about BP 167/101. MD verbal order to hold heparin and order SCDs.

## 2013-06-27 NOTE — ED Provider Notes (Signed)
Medical screening examination/treatment/procedure(s) were conducted as a shared visit with non-physician practitioner(s) and myself.  I personally evaluated the patient during the encounter.  Patient evaluated for right upper quadrant pain onset after coughing, sharp in quality. Pain improved with IV narcotics intermittently. Has nausea no vomiting. On exam abdomen is point tenderness right lower ribs without abdominal tenderness. CT scan reviewed and discussed with general surgery - recommends medicine admit for questionable developing sigmoid volvulus.  MED consulted for admit.   EKG Interpretation   Date/Time:  Friday June 27 2013 00:34:17 EST Ventricular Rate:  80 PR Interval:  134 QRS Duration: 87 QT Interval:  342 QTC Calculation: 394 R Axis:   67 Text Interpretation:  Sinus rhythm Nonspecific ST abnormality No  significant change since last tracing Confirmed by Amaury Kuzel  MD, Lynch  [phone]) on 06/27/2013 12:46:46 AM       Teressa Lower, MD 06/27/13 0730

## 2013-06-27 NOTE — ED Notes (Signed)
Family at bedside. 

## 2013-06-27 NOTE — Consult Note (Signed)
Donald Hanson June 19, 1950  [id number].    Requesting MD:  Dr. Marnette Burgess Chief Complaint/Reason for Consult: Abd pain/Questioning Sigmoid Volvulus on CT HPI:  Donald Hanson is a 63 y.o. male with a hx of HTN, DM, HLD complaining of RUQ pain that started at rest on 06/26/13. Pt states he was seated in a chair and when he went to stand up he felt a "pop" and began to experience the severe progressive pain.  He states he was nauseated and dyspepsia, but no vomiting. He initially thought he pulled a muscle, took Vicodin at home without significant relief, thus he came to [facility] for fruther evaluation on 06/27/13.  He states the pain is constant, does not radiate and is localized to the RUQ.   He has had a DaVinci prostatectomy for prostate cancer but denies any other abdominal surgery.  Denies any hx of similar episodes of abdominal pain.  Pt denies diarrhea, hx of hernia. Pt has not passed flatus or had a BM since the pain started.  Last colonoscopy 10 years ago Dr. Carlean Purl, has another one scheduled in April 2015.  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  GI has been consulted and ordered a Barium enema to see if it would decompress him.  However, this was unsuccessful so now he is pending colonoscopic decompression today or tomorrow.  His pain is better controlled today, but his stomach continued to be quite distended.  Lipase is mildly elevated at 263, mild elevations in AST/ALT.     ROS: All systems reviewed and otherwise negative except for as above  Family History  Problem Relation Age of Onset  . Colon cancer Neg Hx   . Cancer - Ovarian Mother   . Other Father     Past Medical History  Diagnosis Date  . Diabetes   . Hypertension   . Hyperlipidemia   . Seasonal allergies   . Cancer 2010    prostate cancer    Past Surgical History  Procedure Laterality Date  . Prostatectomy  2010  . Nasal fracture  1972    Social History:  reports that he quit smoking  about 45 years ago. He has never used smokeless tobacco. He reports that he drinks about 0.6 ounces of alcohol per week. He reports that he does not use illicit drugs.  Allergies: No Known Allergies  Medications Prior to Admission  Medication Sig Dispense Refill  . atorvastatin (LIPITOR) 80 MG tablet Take 80 mg by mouth daily.      Marland Kitchen glipiZIDE (GLUCOTROL XL) 5 MG 24 hr tablet Take 5 mg by mouth 2 (two) times daily.      Marland Kitchen HYDROcodone-acetaminophen (NORCO) 10-325 MG per tablet Take 1 tablet by mouth every 6 (six) hours as needed for moderate pain or severe pain.      Marland Kitchen lisinopril (PRINIVIL,ZESTRIL) 40 MG tablet Take 1 tablet by mouth daily.      . metFORMIN (GLUMETZA) 500 MG (MOD) 24 hr tablet Take 500 mg by mouth 2 (two) times daily with a meal. Takes 2 tablets twice daily      . Omega-3 Fatty Acids (FISH OIL) 1000 MG CPDR Take by mouth 2 (two) times daily.      . sitaGLIPtin (JANUVIA) 100 MG tablet Take 100 mg by mouth daily.      . Vitamin D, Cholecalciferol, 1000 UNITS TABS Take by mouth 2 (two) times daily.        Blood pressure 162/92,  pulse 78, temperature 98 F (36.7 C), temperature source Oral, resp. rate 16, height 5' 10"  (1.778 m), weight 208 lb 1.6 oz (94.394 kg), SpO2 93.00%.  Physical Exam: General: pleasant, WD/WN white male who is laying in bed in NAD HEENT: head is normocephalic, atraumatic.  Sclera are noninjected.  PERRL.  Ears and nose without any masses or lesions.  Mouth is pink and moist Heart: regular, rate, and rhythm.  No obvious murmurs, gallops, or rubs noted.  Palpable pedal pulses bilaterally Lungs: CTAB, no wheezes, rhonchi, or rales noted.  Respiratory effort nonlabored Abd: Very distended, Only tender to RUQ. +BS to Left side, tympanic, Hypoactive with intermittent "tinkling" bowel sounds to right side, no masses, hernias, or organomegaly appreciated, but palpation limited by distention MS: all 4 extremities are symmetrical with no cyanosis, clubbing, or  edema. Psych: A&Ox3 with an appropriate affect.  Results for orders placed during the hospital encounter of 06/27/13 (from the past 48 hour(s))  I-STAT TROPOININ, ED     Status: None   Collection Time    06/27/13 12:45 AM      Result Value Ref Range   Troponin i, poc 0.00  0.00 - 0.08 ng/mL   Comment 3            Comment: Due to the release kinetics of cTnI,     a negative result within the first hours     of the onset of symptoms does not rule out     myocardial infarction with certainty.     If myocardial infarction is still suspected,     repeat the test at appropriate intervals.  CBC WITH DIFFERENTIAL     Status: Abnormal   Collection Time    06/27/13 12:47 AM      Result Value Ref Range   WBC 6.1  4.0 - 10.5 K/uL   RBC 4.83  4.22 - 5.81 MIL/uL   Hemoglobin 14.5  13.0 - 17.0 g/dL   HCT 41.9  39.0 - 52.0 %   MCV 86.7  78.0 - 100.0 fL   MCH 30.0  26.0 - 34.0 pg   MCHC 34.6  30.0 - 36.0 g/dL   RDW 13.1  11.5 - 15.5 %   Platelets 245  150 - 400 K/uL   Neutrophils Relative % 49  43 - 77 %   Neutro Abs 3.0  1.7 - 7.7 K/uL   Lymphocytes Relative 30  12 - 46 %   Lymphs Abs 1.8  0.7 - 4.0 K/uL   Monocytes Relative 17 (*) 3 - 12 %   Monocytes Absolute 1.0  0.1 - 1.0 K/uL   Eosinophils Relative 3  0 - 5 %   Eosinophils Absolute 0.2  0.0 - 0.7 K/uL   Basophils Relative 1  0 - 1 %   Basophils Absolute 0.0  0.0 - 0.1 K/uL  COMPREHENSIVE METABOLIC PANEL     Status: Abnormal   Collection Time    06/27/13 12:47 AM      Result Value Ref Range   Sodium 139  137 - 147 mEq/L   Potassium 4.5  3.7 - 5.3 mEq/L   Chloride 102  96 - 112 mEq/L   CO2 24  19 - 32 mEq/L   Glucose, Bld 157 (*) 70 - 99 mg/dL   BUN 18  6 - 23 mg/dL   Creatinine, Ser 1.01  0.50 - 1.35 mg/dL   Calcium 10.1  8.4 - 10.5 mg/dL   Total Protein 6.7  6.0 - 8.3 g/dL   Albumin 4.3  3.5 - 5.2 g/dL   AST 41 (*) 0 - 37 U/L   ALT 56 (*) 0 - 53 U/L   Alkaline Phosphatase 59  39 - 117 U/L   Total Bilirubin 0.6  0.3 - 1.2  mg/dL   GFR calc non Af Amer 78 (*) >90 mL/min   GFR calc Af Amer >90  >90 mL/min   Comment: (NOTE)     The eGFR has been calculated using the CKD EPI equation.     This calculation has not been validated in all clinical situations.     eGFR's persistently <90 mL/min signify possible Chronic Kidney     Disease.  LIPASE, BLOOD     Status: Abnormal   Collection Time    06/27/13 12:47 AM      Result Value Ref Range   Lipase 263 (*) 11 - 59 U/L  URINALYSIS, ROUTINE W REFLEX MICROSCOPIC     Status: Abnormal   Collection Time    06/27/13  2:23 AM      Result Value Ref Range   Color, Urine YELLOW  YELLOW   APPearance CLEAR  CLEAR   Specific Gravity, Urine 1.025  1.005 - 1.030   pH 5.5  5.0 - 8.0   Glucose, UA 100 (*) NEGATIVE mg/dL   Hgb urine dipstick NEGATIVE  NEGATIVE   Bilirubin Urine NEGATIVE  NEGATIVE   Ketones, ur NEGATIVE  NEGATIVE mg/dL   Protein, ur NEGATIVE  NEGATIVE mg/dL   Urobilinogen, UA 0.2  0.0 - 1.0 mg/dL   Nitrite NEGATIVE  NEGATIVE   Leukocytes, UA NEGATIVE  NEGATIVE   Comment: MICROSCOPIC NOT DONE ON URINES WITH NEGATIVE PROTEIN, BLOOD, LEUKOCYTES, NITRITE, OR GLUCOSE <1000 mg/dL.  LACTATE DEHYDROGENASE     Status: None   Collection Time    06/27/13  8:30 AM      Result Value Ref Range   LDH 204  94 - 250 U/L   Comment: HEMOLYSIS AT THIS LEVEL MAY AFFECT RESULT  I-STAT CG4 LACTIC ACID, ED     Status: None   Collection Time    06/27/13  8:31 AM      Result Value Ref Range   Lactic Acid, Venous 0.87  0.5 - 2.2 mmol/L  GLUCOSE, CAPILLARY     Status: Abnormal   Collection Time    06/27/13 12:29 PM      Result Value Ref Range   Glucose-Capillary 126 (*) 70 - 99 mg/dL   Dg Ribs Unilateral W/chest Right  06/27/2013   CLINICAL DATA:  Chronic cough and congestion for 3 months. Sharp pain anterior right chest. Increases with motion. Hypertension.  EXAM: RIGHT RIBS AND CHEST - 3+ VIEW  COMPARISON:  06/16/2008 chest x-ray.  FINDINGS: No evidence of right-sided rib  fracture or pneumothorax.  Poor inspiration. Central pulmonary vascular prominence. Minimal peribronchial thickening. No segmental consolidation.  No plain film evidence of pulmonary malignancy. If there is a persistent unexplained cough followup imaging may be considered.  Cardiomegaly.  Calcified mildly tortuous aorta.  IMPRESSION: No evidence of right-sided rib fracture or pneumothorax.  Please see above.   Electronically Signed   By: Chauncey Cruel M.D.   On: 06/27/2013 02:16   US Abdomen Complete  06/27/2013   CLINICAL DATA:  Chest pain  EXAM: ULTRASOUND ABDOMEN COMPLETE  COMPARISON:  None.  FINDINGS: Gallbladder:  No gallstones or wall thickening visualized. No sonographic Murphy sign noted.  Common bile duct:  Diameter:  Not visualized secondary to bowel gas.  Liver:  Poorly delineated secondary to habitus and bowel gas. Findings suggestive of diffuse fatty infiltration.  IVC:  Not visualized secondary to habitus and bowel gas. Not visualized secondary to habitus and bowel gas.  Pancreas:  Visualized portion unremarkable.  Spleen:  Size and appearance within normal limits.  Right Kidney:  Length: 11.6 cm. Echogenicity within normal limits. No mass or hydronephrosis visualized.  Left Kidney:  Length: 12.1 cm. Echogenicity within normal limits. No mass or hydronephrosis visualized.  Abdominal aorta:  Not well delineated secondary to habitus and bowel gas.  IMPRESSION:  No gallstones detected.  Fatty liver suspected.  Poorly delineated pancreas, inferior vena cava, abdominal aorta and portions of liver secondary to habitus and bowel gas   Electronically Signed   By: Chauncey Cruel M.D.   On: 06/27/2013 03:51   Ct Abdomen Pelvis W Contrast  06/27/2013   CLINICAL DATA:  Sudden onset of right upper quadrant abdominal pain.  EXAM: CT ABDOMEN AND PELVIS WITH CONTRAST  TECHNIQUE: Multidetector CT imaging of the abdomen and pelvis was performed using the standard protocol following bolus administration of intravenous  contrast.  CONTRAST:  153m OMNIPAQUE IOHEXOL 300 MG/ML  SOLN  COMPARISON:  Abdominal and 06/27/2013.  FINDINGS: Minimal dependent atelectasis is present bilaterally. The lungs are otherwise clear. The heart size is upper limits of normal coronary artery calcifications are present.  Diffuse fatty infiltration is present in the liver. No discrete lesions are evident. The spleen is within normal limits. The stomach and duodenum, and pancreas are within normal limits. The common bile duct and gallbladder are normal. The adrenal glands are within normal limits bilaterally. The kidneys and ureters are within normal limits.  The distal sigmoid colon is collapsed. The sigmoid extends into the right upper quadrant where the apex of the loop is dilated and and the more proximal colon is within normal limits. The appendix is visualized and normal.  Atherosclerotic calcifications are present within the abdominal aorta and branch vessels without aneurysm.  The the bone windows are unremarkable.  IMPRESSION: 1. The sigmoid colon stretches to the right upper quadrant and is dilated near its apex concerning for a developing sigmoid volvulus. The sigmoid and is not twisted. 2. No evidence for rupture or free air. 3. Diffuse fatty infiltration of the liver. 4. Atherosclerosis including coronary artery disease.   Electronically Signed   By: CLawrence SantiagoM.D.   On: 06/27/2013 07:11   Dg Colon W/cm - Wo/w Kub  06/27/2013   CLINICAL DATA:  Evaluate sigmoid volvulus seen on recent CT abdomen 06/27/2013  EXAM: BE WITH CONTRAST - WITHOUT AND WITH KUB  COMPARISON:  CT ABD/PELVIS W CM dated 06/27/2013  FLUOROSCOPY TIME:  1 min 1 second  FINDINGS: There is contrast filling the sigmoid colon. The contrast column abruptly terminates in the right upper quadrant with no contrast traversing this area. The appearance is concerning for a sigmoid volvulus.  There is no contrast extravasation.  There is no soft tissue mass.  IMPRESSION: Contrast  filling the sigmoid colon with the contrast column abruptly terminated in the right upper quadrant with no contrast traversing this area into the more proximal colon. The appearance is concerning for a sigmoid volvulus. Gastroenterology consultation recommended.   Electronically Signed   By: HKathreen Devoid  On: 06/27/2013 12:13      Assessment/Plan Sigmoid Volvulus Hypertension DM HLD H/o Prostate cancer s/p DaVinci prostatectomy 2010  Plan: 1.  Barium enema  not successful in decompressing and xrays still show sigmoid volvulus.  Continue conservative measures including NPO,  IVF, pain control, antiemetics 2.  SCD's and heparin for DVT proph 3.  Ambulate and IS 4. GI to attempt decompression via colonoscopy this evening or tomorrow. 5. Medicine to manage other comorbidities.  6. Consider for surgery if no relief from decompression, but not preferred management at this time.   Barrington Ellison, PA-S [facility] Surgery 06/27/2013, 1:32 PM Pager: [phone]  ----------------------------------------------------------------------------------------------------------- General Surgery PA Preceptor Note:  I agree with the above PA students findings as above.  Made changes above as needed.  Coralie Keens, PA-C General Surgery [facility] Pager: [phone] Office: [phone]

## 2013-06-27 NOTE — ED Provider Notes (Signed)
CSN: [id number]     Arrival date & time 06/27/13  0025 History   First MD Initiated Contact with Patient 06/27/13 0032     Chief Complaint  Patient presents with  . Abdominal Pain     (Consider location/radiation/quality/duration/timing/severity/associated sxs/prior Treatment) Patient is a 63 y.o. male presenting with chest pain. The history is provided by the patient and the spouse. No language interpreter was used.  Chest Pain Pain location:  R chest Pain quality: sharp   Pain radiates to:  Does not radiate Pain radiates to the back: no   Associated symptoms: nausea   Associated symptoms: no abdominal pain, no cough, no fever, no shortness of breath and not vomiting   Associated symptoms comment:  He stood, coughed and had sudden onset of sharp, right sided chest pain in lower anterior chest. He states there is significant pain with movement, respirations and palpation. No abdominal pain. He reports nausea without vomiting. No recent illness, no fever. He has not had a regular cough. There is no SOB.    Past Medical History  Diagnosis Date  . Diabetes   . Hypertension   . Hyperlipidemia   . Seasonal allergies   . Cancer 2010    prostate cancer   Past Surgical History  Procedure Laterality Date  . Prostatectomy  2010  . Nasal fracture  1972   Family History  Problem Relation Age of Onset  . Colon cancer Neg Hx    History  Substance Use Topics  . Smoking status: Former Smoker    Quit date: 04/24/1968  . Smokeless tobacco: Never Used  . Alcohol Use: 0.6 oz/week    1 Cans of beer per week    Review of Systems  Constitutional: Negative for fever and chills.  Respiratory: Negative.  Negative for cough and shortness of breath.   Cardiovascular: Positive for chest pain.  Gastrointestinal: Positive for nausea. Negative for vomiting, abdominal pain and diarrhea.  Genitourinary: Negative.  Negative for dysuria, hematuria and flank pain.  Musculoskeletal: Negative.   Skin:  Negative.   Neurological: Negative.       Allergies  Review of patient's allergies indicates no known allergies.  Home Medications   Current Outpatient Rx  Name  Route  Sig  Dispense  Refill  . atorvastatin (LIPITOR) 80 MG tablet   Oral   Take 80 mg by mouth daily.         Marland Kitchen glipiZIDE (GLUCOTROL XL) 5 MG 24 hr tablet   Oral   Take 5 mg by mouth 2 (two) times daily.         Marland Kitchen losartan (COZAAR) 100 MG tablet   Oral   Take 100 mg by mouth daily.         . metFORMIN (GLUMETZA) 500 MG (MOD) 24 hr tablet   Oral   Take 500 mg by mouth 2 (two) times daily with a meal. Takes 2 tablets twice daily         . Na Sulfate-K Sulfate-Mg Sulf (SUPREP BOWEL PREP) SOLN   Oral   Take 1 kit by mouth once. suprep as directed.  No substitutions   354 mL   0   . Omega-3 Fatty Acids (FISH OIL) 1000 MG CPDR   Oral   Take by mouth 2 (two) times daily.         . sitaGLIPtin (JANUVIA) 100 MG tablet   Oral   Take 100 mg by mouth daily.         Marland Kitchen  Vitamin D, Cholecalciferol, 1000 UNITS TABS   Oral   Take by mouth 2 (two) times daily.          There were no vitals taken for this visit. Physical Exam  Constitutional: He is oriented to person, place, and time. He appears well-developed and well-nourished. No distress.  Neck: Normal range of motion.  Cardiovascular: Normal rate.   No murmur heard. Pulmonary/Chest: Effort normal. He has no wheezes. He has no rales. He exhibits tenderness.  Right lower anterior chest wall tenderness. No swelling, discoloration or bony deformity.  Abdominal: Bowel sounds are normal. There is no rebound and no guarding.  RUQ tenderness that refers to right lower chest.   Musculoskeletal: Normal range of motion.  Neurological: He is alert and oriented to person, place, and time.  Skin: Skin is warm and dry.  Psychiatric: He has a normal mood and affect.    ED Course  Procedures (including critical care time) Labs Review Labs Reviewed  CBC  WITH DIFFERENTIAL  COMPREHENSIVE METABOLIC PANEL  LIPASE, BLOOD  URINALYSIS, ROUTINE W REFLEX MICROSCOPIC  I-STAT TROPOININ, ED   Results for orders placed during the hospital encounter of 06/27/13  CBC WITH DIFFERENTIAL      Result Value Ref Range   WBC 6.1  4.0 - 10.5 K/uL   RBC 4.83  4.22 - 5.81 MIL/uL   Hemoglobin 14.5  13.0 - 17.0 g/dL   HCT 41.9  39.0 - 52.0 %   MCV 86.7  78.0 - 100.0 fL   MCH 30.0  26.0 - 34.0 pg   MCHC 34.6  30.0 - 36.0 g/dL   RDW 13.1  11.5 - 15.5 %   Platelets 245  150 - 400 K/uL   Neutrophils Relative % 49  43 - 77 %   Neutro Abs 3.0  1.7 - 7.7 K/uL   Lymphocytes Relative 30  12 - 46 %   Lymphs Abs 1.8  0.7 - 4.0 K/uL   Monocytes Relative 17 (*) 3 - 12 %   Monocytes Absolute 1.0  0.1 - 1.0 K/uL   Eosinophils Relative 3  0 - 5 %   Eosinophils Absolute 0.2  0.0 - 0.7 K/uL   Basophils Relative 1  0 - 1 %   Basophils Absolute 0.0  0.0 - 0.1 K/uL  COMPREHENSIVE METABOLIC PANEL      Result Value Ref Range   Sodium 139  137 - 147 mEq/L   Potassium 4.5  3.7 - 5.3 mEq/L   Chloride 102  96 - 112 mEq/L   CO2 24  19 - 32 mEq/L   Glucose, Bld 157 (*) 70 - 99 mg/dL   BUN 18  6 - 23 mg/dL   Creatinine, Ser 1.01  0.50 - 1.35 mg/dL   Calcium 10.1  8.4 - 10.5 mg/dL   Total Protein 6.7  6.0 - 8.3 g/dL   Albumin 4.3  3.5 - 5.2 g/dL   AST 41 (*) 0 - 37 U/L   ALT 56 (*) 0 - 53 U/L   Alkaline Phosphatase 59  39 - 117 U/L   Total Bilirubin 0.6  0.3 - 1.2 mg/dL   GFR calc non Af Amer 78 (*) >90 mL/min   GFR calc Af Amer >90  >90 mL/min  LIPASE, BLOOD      Result Value Ref Range   Lipase 263 (*) 11 - 59 U/L  I-STAT TROPOININ, ED      Result Value Ref Range   Troponin i,  poc 0.00  0.00 - 0.08 ng/mL   Comment 3            Dg Ribs Unilateral W/chest Right  06/27/2013   CLINICAL DATA:  Chronic cough and congestion for 3 months. Sharp pain anterior right chest. Increases with motion. Hypertension.  EXAM: RIGHT RIBS AND CHEST - 3+ VIEW  COMPARISON:  06/16/2008 chest  x-ray.  FINDINGS: No evidence of right-sided rib fracture or pneumothorax.  Poor inspiration. Central pulmonary vascular prominence. Minimal peribronchial thickening. No segmental consolidation.  No plain film evidence of pulmonary malignancy. If there is a persistent unexplained cough followup imaging may be considered.  Cardiomegaly.  Calcified mildly tortuous aorta.  IMPRESSION: No evidence of right-sided rib fracture or pneumothorax.  Please see above.   Electronically Signed   By: Chauncey Cruel M.D.   On: 06/27/2013 02:16   Imaging Review No results found.   EKG Interpretation None      MDM   Final diagnoses:  None    1. Abdominal pain 2. Pancreatitis  No pulmonary abnormalities or rib fractures on exam. His transaminases were slightly elevated with lipase of 263, indicating mild pancreatitis. No history of same.   US abdomen show no gall stones. CT scan ordered - findings discussed with patient and family. He has been comfortable with pain medications here. Nausea controlled with Zofran.   CT pending at time of patient care transfer.  Dewaine Oats, PA-C 06/27/13 [phone]

## 2013-06-27 NOTE — Progress Notes (Signed)
Pt.  Admitted to [facility] 17 from ED.  Pt. Placed in bed and vitals taken.  Will continue to monitor.  Alphonzo Lemmings, RN

## 2013-06-27 NOTE — Consult Note (Signed)
[facility] Consult: 8:34 AM 06/27/2013  LOS: 0 days    Referring Provider: Dr Olevia Bowens and Surgeon  Primary Care Physician:  Marylene Land, MD Primary Gastroenterologist:  Dr. Carlean Purl, has never seen in office.      Reason for Consultation:  RUQ pain.  ? Sigmoid volvulus.  Lipase is elevated.    HPI: JAHMAD PETRICH is a 63 y.o. male.  Hx prostate cancer.  S/p Prostatectomy 2010.  S/p right ureteral stone extraction/cystoscopy 2005. Generally active and strong.  Due to inclement weather, 06/11/13 direct screening colonoscopy  rescheduled for 07/23/13.  Previous screening colonoscopy ~ 10 yrs ago by Dr Carlean Purl.   Presented with abd pain to ED this AM.  Began at rest last evening in RUQ as bloating, pressure sensation.  Progress in intensity, some nausea but no emesis.  No relief after taking Vicodin.  However Dilaudid in ED is effective for several hours.  Last BM was a few hours prior to onset.  No recent changes in his digestive health.  Ultrasound shows fatty liver.  The transaminases are minimally elevated at 41/56which has been present before.  Lipase is 263. CT ab/pelvis shows dilated sigmoid, concerning for volvulus. Also confirms fatty liver.      Past Medical History  Diagnosis Date  . Diabetes   . Hypertension   . Hyperlipidemia   . Seasonal allergies   . Cancer 2010    prostate cancer    Past Surgical History  Procedure Laterality Date  . Prostatectomy  2010  . Nasal fracture  1972    Prior to Admission medications   Medication Sig Start Date End Date Taking? Authorizing Provider  atorvastatin (LIPITOR) 80 MG tablet Take 80 mg by mouth daily.   Yes Historical Provider, MD  glipiZIDE (GLUCOTROL XL) 5 MG 24 hr tablet Take 5 mg by mouth 2 (two) times daily.   Yes Historical Provider, MD    HYDROcodone-acetaminophen (NORCO) 10-325 MG per tablet Take 1 tablet by mouth every 6 (six) hours as needed for moderate pain or severe pain.   Yes Historical Provider, MD  lisinopril (PRINIVIL,ZESTRIL) 40 MG tablet Take 1 tablet by mouth daily. 05/02/13  Yes Historical Provider, MD  metFORMIN (GLUMETZA) 500 MG (MOD) 24 hr tablet Take 500 mg by mouth 2 (two) times daily with a meal. Takes 2 tablets twice daily   Yes Historical Provider, MD  Omega-3 Fatty Acids (FISH OIL) 1000 MG CPDR Take by mouth 2 (two) times daily.   Yes Historical Provider, MD  sitaGLIPtin (JANUVIA) 100 MG tablet Take 100 mg by mouth daily.   Yes Historical Provider, MD  Vitamin D, Cholecalciferol, 1000 UNITS TABS Take by mouth 2 (two) times daily.   Yes Historical Provider, MD    Scheduled Meds:   Infusions:   PRN Meds: HYDROmorphone (DILAUDID) injection   Allergies as of 06/27/2013  . (No Known Allergies)    Family History  Problem Relation Age of Onset  . Colon cancer Neg Hx     History   Social History  . Marital Status:  Divorced    Spouse Name: N/A    Number of Children: N/A  . Years of Education: N/A   Occupational History  . 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    Social History Main Topics  . Smoking status: Former Smoker    Quit date: 04/24/1968  . Smokeless tobacco: Never Used  . Alcohol Use: 0.6 oz/week    1 Cans of beer per week  . Drug Use: No  . Sexual Activity: Not on file    REVIEW OF SYSTEMS: Constitutional:  Generally strong.  Able to do physical labor without issues.  No weight loss ENT:  No nose bleeds, no congestion. Pulm:  Clear bil.  No dyspnea or cough CV:  No palpitations, no LE edema.  GU:  No hematuria, no frequency, no incontinence. GI:  Per HPI.  No heartburn, no dysphagia.  No constipation Heme:  No anemia or need of iron supplements   Transfusions:  none Neuro:  No headaches, no peripheral tingling or numbness Derm:  No itching, no rash or sores.   Endocrine:  No sweats or chills.  No polyuria or dysuria Immunization:  Current on flu shot Travel:  None beyond local counties in last few months.    PHYSICAL EXAM: Vital signs in last 24 hours: Filed Vitals:   06/27/13 0718  BP: 161/96  Pulse: 70  Temp: 97.5 F (36.4 C)  Resp: 11   Wt Readings from Last 3 Encounters:  05/30/13 97.07 kg (214 lb)   General: pleasant, looks well and comfortable Head:  No asymmetry or swelling  Eyes:  No icterus or pallor Ears:  Not HOH  Nose:  No discharge, no congestion Mouth:  Clear, moist. Neck:  No JVD, no mass Lungs:  Clear bil.  No cough or dyspnea Heart: RRR Abdomen:  Protuberant, tense, hypoactive BS, no increased tympany.   Rectal: not performed   Musc/Skeltl: no joint swelling or deformity Extremities:  No CCE  Neurologic:  Pleasant, oriented x 3.  Not drowsy.  No tremor.  Full and symmetric limb strength.  Skin:  No rash, no sores, no telangectasia Nodes:  No cervical adenopathy.    Psych:  Pleasant, relaxed, cooperative.   Intake/Output from previous day:   Intake/Output this shift:    LAB RESULTS:  Recent Labs  06/27/13 0047  WBC 6.1  HGB 14.5  HCT 41.9  PLT 245   BMET Lab Results  Component Value Date   NA 139 06/27/2013   NA 137 06/16/2008   K 4.5 06/27/2013   K 4.2 06/16/2008   CL 102 06/27/2013   CL 102 06/16/2008   CO2 24 06/27/2013   CO2 28 06/16/2008   GLUCOSE 157* 06/27/2013   GLUCOSE 126* 06/16/2008   BUN 18 06/27/2013   BUN 11 06/16/2008   CREATININE 1.01 06/27/2013   CREATININE 0.92 06/16/2008   CALCIUM 10.1 06/27/2013   CALCIUM 9.5 06/16/2008   LFT  Recent Labs  06/27/13 0047  PROT 6.7  ALBUMIN 4.3  AST 41*  ALT 56*  ALKPHOS 59  BILITOT 0.6   PT/INR No results found for this basename: INR, PROTIME   Hepatitis Panel No results found for this basename: HEPBSAG, HCVAB, HEPAIGM, HEPBIGM,  in the last 72 hours Lipase     Component Value Date/Time   LIPASE 263* 06/27/2013 0047    Drugs of Abuse   No results found for this basename: labopia, cocainscrnur, labbenz, amphetmu, thcu, labbarb     RADIOLOGY  STUDIES: Dg Ribs Unilateral W/chest Right 06/27/2013  FINDINGS: No evidence of right-sided rib fracture or pneumothorax.  Poor inspiration. Central pulmonary vascular prominence. Minimal peribronchial thickening. No segmental consolidation.  No plain film evidence of pulmonary malignancy. If there is a persistent unexplained cough followup imaging may be considered.  Cardiomegaly.  Calcified mildly tortuous aorta.  IMPRESSION: No evidence of right-sided rib fracture or pneumothorax.  Please see above.   Electronically Signed   By: Bridgett Larsson M.D.   On: 06/27/2013 02:16   US Abdomen Complete 06/27/2013     FINDINGS: Gallbladder:  No gallstones or wall thickening visualized. No sonographic Murphy sign noted.  Common bile duct:  Diameter: Not visualized secondary to bowel gas.  Liver:  Poorly delineated secondary to habitus and bowel gas. Findings suggestive of diffuse fatty infiltration.  IVC:  Not visualized secondary to habitus and bowel gas. Not visualized secondary to habitus and bowel gas.  Pancreas:  Visualized portion unremarkable.  Spleen:  Size and appearance within normal limits.  Right Kidney:  Length: 11.6 cm. Echogenicity within normal limits. No mass or hydronephrosis visualized.  Left Kidney:  Length: 12.1 cm. Echogenicity within normal limits. No mass or hydronephrosis visualized.  Abdominal aorta:  Not well delineated secondary to habitus and bowel gas.  IMPRESSION:  No gallstones detected.  Fatty liver suspected.  Poorly delineated pancreas, inferior vena cava, abdominal aorta and portions of liver secondary to habitus and bowel gas   Electronically Signed   By: Bridgett Larsson M.D.   On: 06/27/2013 03:51   Ct Abdomen Pelvis W Contrast 06/27/2013   FINDINGS: Minimal dependent atelectasis is present bilaterally. The lungs are otherwise clear. The heart size is upper limits of normal coronary  artery calcifications are present.  Diffuse fatty infiltration is present in the liver. No discrete lesions are evident. The spleen is within normal limits. The stomach and duodenum, and pancreas are within normal limits. The common bile duct and gallbladder are normal. The adrenal glands are within normal limits bilaterally. The kidneys and ureters are within normal limits.  The distal sigmoid colon is collapsed. The sigmoid extends into the right upper quadrant where the apex of the loop is dilated and and the more proximal colon is within normal limits. The appendix is visualized and normal.  Atherosclerotic calcifications are present within the abdominal aorta and branch vessels without aneurysm.  The the bone windows are unremarkable.  IMPRESSION: 1. The sigmoid colon stretches to the right upper quadrant and is dilated near its apex concerning for a developing sigmoid volvulus. The sigmoid and is not twisted. 2. No evidence for rupture or free air. 3. Diffuse fatty infiltration of the liver. 4. Atherosclerosis including coronary artery disease.   Electronically Signed   By: Gennette Pac M.D.   On: 06/27/2013 07:11    ENDOSCOPIC STUDIES: Colonoscopy  ~ 2005, unremarkable screening study per pt.  Per Dr Leone Payor, pt is set up for 10 yr follow up study in April 2015.   IMPRESSION:   *  Sigmoid volvulus?  Interestingly his pain and tenderness are focused in the RUQ.   *  Pancreatitis?  No imaging evidence to support this but lipase is 263.   No evidence of biliary disease.   *  Fatty liver.  Mild elevation of transaminases, this is norm for him.   *  Type 2 DM, diet controlled  *  Hx prostate cancer.  Treated with robotic prostatectomy 2010.  No chemo or radiation required.  PLAN:     *  Case d/w Dr Henrene Pastor.  Will order barium enema for diagnostic and therapeutic purposes.  *  Lipase, CMET in AM   Azucena Freed  06/27/2013, 8:34 AM Pager: [phone]     GI ATTENDING  History,  laboratories, x-rays reviewed. Patient personally seen and examined. Agree with H&P as above. Presents with what appears to be a sigmoid volvulus. Barium study supportive but not therapeutic. For colonoscopy. See report.  Docia Chuck. Geri Seminole., M.D. [facility] of [facility]

## 2013-06-27 NOTE — ED Notes (Signed)
Patient transported to CT 

## 2013-06-27 NOTE — ED Provider Notes (Signed)
Medical screening examination/treatment/procedure(s) were performed by non-physician practitioner and as supervising physician I was immediately available for consultation/collaboration.   EKG Interpretation   Date/Time:  Friday June 27 2013 00:34:17 EST Ventricular Rate:  80 PR Interval:  134 QRS Duration: 87 QT Interval:  342 QTC Calculation: 394 R Axis:   67 Text Interpretation:  Sinus rhythm Nonspecific ST abnormality No  significant change since last tracing Confirmed by Cambren Helm  MD, [place]  [phone]) on 06/27/2013 12:46:46 AM       Teressa Lower, MD 06/27/13 [phone]

## 2013-06-27 NOTE — ED Provider Notes (Signed)
Patient signed out to me by [place], PA-C. Dr. Montez Morita discussed case with general surgery who recommend admission to medicine with possible GI consult for decompression. Discussed case with Dr. Aileen Fass who agrees to admit patient. Admission appreciated. Vital signs stable at this time.  Results for orders placed during the hospital encounter of 06/27/13  CBC WITH DIFFERENTIAL      Result Value Ref Range   WBC 6.1  4.0 - 10.5 K/uL   RBC 4.83  4.22 - 5.81 MIL/uL   Hemoglobin 14.5  13.0 - 17.0 g/dL   HCT 41.9  39.0 - 52.0 %   MCV 86.7  78.0 - 100.0 fL   MCH 30.0  26.0 - 34.0 pg   MCHC 34.6  30.0 - 36.0 g/dL   RDW 13.1  11.5 - 15.5 %   Platelets 245  150 - 400 K/uL   Neutrophils Relative % 49  43 - 77 %   Neutro Abs 3.0  1.7 - 7.7 K/uL   Lymphocytes Relative 30  12 - 46 %   Lymphs Abs 1.8  0.7 - 4.0 K/uL   Monocytes Relative 17 (*) 3 - 12 %   Monocytes Absolute 1.0  0.1 - 1.0 K/uL   Eosinophils Relative 3  0 - 5 %   Eosinophils Absolute 0.2  0.0 - 0.7 K/uL   Basophils Relative 1  0 - 1 %   Basophils Absolute 0.0  0.0 - 0.1 K/uL  COMPREHENSIVE METABOLIC PANEL      Result Value Ref Range   Sodium 139  137 - 147 mEq/L   Potassium 4.5  3.7 - 5.3 mEq/L   Chloride 102  96 - 112 mEq/L   CO2 24  19 - 32 mEq/L   Glucose, Bld 157 (*) 70 - 99 mg/dL   BUN 18  6 - 23 mg/dL   Creatinine, Ser 1.01  0.50 - 1.35 mg/dL   Calcium 10.1  8.4 - 10.5 mg/dL   Total Protein 6.7  6.0 - 8.3 g/dL   Albumin 4.3  3.5 - 5.2 g/dL   AST 41 (*) 0 - 37 U/L   ALT 56 (*) 0 - 53 U/L   Alkaline Phosphatase 59  39 - 117 U/L   Total Bilirubin 0.6  0.3 - 1.2 mg/dL   GFR calc non Af Amer 78 (*) >90 mL/min   GFR calc Af Amer >90  >90 mL/min  LIPASE, BLOOD      Result Value Ref Range   Lipase 263 (*) 11 - 59 U/L  URINALYSIS, ROUTINE W REFLEX MICROSCOPIC      Result Value Ref Range   Color, Urine YELLOW  YELLOW   APPearance CLEAR  CLEAR   Specific Gravity, Urine 1.025  1.005 - 1.030   pH 5.5  5.0 - 8.0   Glucose, UA 100 (*) NEGATIVE mg/dL   Hgb urine dipstick NEGATIVE  NEGATIVE   Bilirubin Urine NEGATIVE  NEGATIVE   Ketones, ur NEGATIVE  NEGATIVE mg/dL   Protein, ur NEGATIVE  NEGATIVE mg/dL   Urobilinogen, UA 0.2  0.0 - 1.0 mg/dL   Nitrite NEGATIVE  NEGATIVE   Leukocytes, UA NEGATIVE  NEGATIVE  I-STAT TROPOININ, ED      Result Value Ref Range   Troponin i, poc 0.00  0.00 - 0.08 ng/mL   Comment 3             Dg Ribs Unilateral W/chest Right  06/27/2013   CLINICAL DATA:  Chronic cough and congestion  for 3 months. Sharp pain anterior right chest. Increases with motion. Hypertension.  EXAM: RIGHT RIBS AND CHEST - 3+ VIEW  COMPARISON:  06/16/2008 chest x-ray.  FINDINGS: No evidence of right-sided rib fracture or pneumothorax.  Poor inspiration. Central pulmonary vascular prominence. Minimal peribronchial thickening. No segmental consolidation.  No plain film evidence of pulmonary malignancy. If there is a persistent unexplained cough followup imaging may be considered.  Cardiomegaly.  Calcified mildly tortuous aorta.  IMPRESSION: No evidence of right-sided rib fracture or pneumothorax.  Please see above.   Electronically Signed   By: Chauncey Cruel M.D.   On: 06/27/2013 02:16   US Abdomen Complete  06/27/2013   CLINICAL DATA:  Chest pain  EXAM: ULTRASOUND ABDOMEN COMPLETE  COMPARISON:  None.  FINDINGS: Gallbladder:  No gallstones or wall thickening visualized. No sonographic Murphy sign noted.  Common bile duct:  Diameter: Not visualized secondary to bowel gas.  Liver:  Poorly delineated secondary to habitus and bowel gas. Findings suggestive of diffuse fatty infiltration.  IVC:  Not visualized secondary to habitus and bowel gas. Not visualized secondary to habitus and bowel gas.  Pancreas:  Visualized portion unremarkable.  Spleen:  Size and appearance within normal limits.  Right Kidney:  Length: 11.6 cm. Echogenicity within normal limits. No mass or hydronephrosis visualized.  Left Kidney:  Length: 12.1  cm. Echogenicity within normal limits. No mass or hydronephrosis visualized.  Abdominal aorta:  Not well delineated secondary to habitus and bowel gas.  IMPRESSION:  No gallstones detected.  Fatty liver suspected.  Poorly delineated pancreas, inferior vena cava, abdominal aorta and portions of liver secondary to habitus and bowel gas   Electronically Signed   By: Chauncey Cruel M.D.   On: 06/27/2013 03:51   Ct Abdomen Pelvis W Contrast  06/27/2013   CLINICAL DATA:  Sudden onset of right upper quadrant abdominal pain.  EXAM: CT ABDOMEN AND PELVIS WITH CONTRAST  TECHNIQUE: Multidetector CT imaging of the abdomen and pelvis was performed using the standard protocol following bolus administration of intravenous contrast.  CONTRAST:  15mL OMNIPAQUE IOHEXOL 300 MG/ML  SOLN  COMPARISON:  Abdominal and 06/27/2013.  FINDINGS: Minimal dependent atelectasis is present bilaterally. The lungs are otherwise clear. The heart size is upper limits of normal coronary artery calcifications are present.  Diffuse fatty infiltration is present in the liver. No discrete lesions are evident. The spleen is within normal limits. The stomach and duodenum, and pancreas are within normal limits. The common bile duct and gallbladder are normal. The adrenal glands are within normal limits bilaterally. The kidneys and ureters are within normal limits.  The distal sigmoid colon is collapsed. The sigmoid extends into the right upper quadrant where the apex of the loop is dilated and and the more proximal colon is within normal limits. The appendix is visualized and normal.  Atherosclerotic calcifications are present within the abdominal aorta and branch vessels without aneurysm.  The the bone windows are unremarkable.  IMPRESSION: 1. The sigmoid colon stretches to the right upper quadrant and is dilated near its apex concerning for a developing sigmoid volvulus. The sigmoid and is not twisted. 2. No evidence for rupture or free air. 3. Diffuse  fatty infiltration of the liver. 4. Atherosclerosis including coronary artery disease.   Electronically Signed   By: Lawrence Santiago M.D.   On: 06/27/2013 07:11     Elwyn Lade, PA-C 06/27/13 [phone]

## 2013-06-28 ENCOUNTER — Inpatient Hospital Stay (HOSPITAL_COMMUNITY): Payer: 59

## 2013-06-28 DIAGNOSIS — R141 Gas pain: Secondary | ICD-10-CM

## 2013-06-28 DIAGNOSIS — R142 Eructation: Secondary | ICD-10-CM

## 2013-06-28 DIAGNOSIS — R143 Flatulence: Secondary | ICD-10-CM

## 2013-06-28 LAB — CBC
HEMATOCRIT: 42.3 % (ref 39.0–52.0)
HEMOGLOBIN: 14.4 g/dL (ref 13.0–17.0)
MCH: 29.5 pg (ref 26.0–34.0)
MCHC: 34 g/dL (ref 30.0–36.0)
MCV: 86.7 fL (ref 78.0–100.0)
PLATELETS: 231 10*3/uL (ref 150–400)
RBC: 4.88 MIL/uL (ref 4.22–5.81)
RDW: 13.1 % (ref 11.5–15.5)
WBC: 8 10*3/uL (ref 4.0–10.5)

## 2013-06-28 LAB — GLUCOSE, CAPILLARY
GLUCOSE-CAPILLARY: 134 mg/dL — AB (ref 70–99)
GLUCOSE-CAPILLARY: 122 mg/dL — AB (ref 70–99)
Glucose-Capillary: 120 mg/dL — ABNORMAL HIGH (ref 70–99)
Glucose-Capillary: 110 mg/dL — ABNORMAL HIGH (ref 70–99)

## 2013-06-28 MED ORDER — BIOTENE DRY MOUTH MT LIQD
15.0000 mL | Freq: Two times a day (BID) | OROMUCOSAL | Status: DC
  Administered 2013-06-28: 15 mL via OROMUCOSAL

## 2013-06-28 MED ORDER — CHLORHEXIDINE GLUCONATE 0.12 % MT SOLN
15.0000 mL | Freq: Two times a day (BID) | OROMUCOSAL | Status: DC
  Administered 2013-06-29: 15 mL via OROMUCOSAL
  Filled 2013-06-28 (×5): qty 15

## 2013-06-28 NOTE — Progress Notes (Addendum)
Daily Rounding Note  06/28/2013, 8:29 AM  LOS: 1 day   SUBJECTIVE:       Has not required Narcotics since yesterday AM. Just wheeled to xray after I examined him.  He is feeling increased abdominal distention c/w after colonoscopy  OBJECTIVE:         Vital signs in last 24 hours:    Temp:  [97.8 F (36.6 C)-98.8 F (37.1 C)] 98.8 F (37.1 C) (03/07 0559) Pulse Rate:  [64-85] 83 (03/07 0559) Resp:  [9-30] 18 (03/07 0559) BP: (131-185)/(71-114) 150/88 mmHg (03/07 0559) SpO2:  [92 %-100 %] 94 % (03/07 0559) Weight:  [94.394 kg (208 lb 1.6 oz)] 94.394 kg (208 lb 1.6 oz) (03/06 1226) Last BM Date: 06/28/13 (pt states "small" bowel movement) General: looks well, NAD   Heart: RRR Chest: clear, no labored breathing Abdomen: distended, moderately tense, +tympanitic/tinkling BS  Extremities: no CCE Neuro/Psych:  Pleasant, alert, no gross deficts, not confused.   Intake/Output from previous day: 03/06 0701 - 03/07 0700 In: -  Out: 500 [Urine:500]  Intake/Output this shift:    Lab Results:  Recent Labs  06/27/13 0047 06/27/13 1421 06/28/13 0550  WBC 6.1 8.6 8.0  HGB 14.5 14.9 14.4  HCT 41.9 43.6 42.3  PLT 245 230 231   BMET  Recent Labs  06/27/13 0047 06/27/13 1421  NA 139  --   K 4.5  --   CL 102  --   CO2 24  --   GLUCOSE 157*  --   BUN 18  --   CREATININE 1.01 0.97  CALCIUM 10.1  --    LFT  Recent Labs  06/27/13 0047  PROT 6.7  ALBUMIN 4.3  AST 41*  ALT 56*  ALKPHOS 59  BILITOT 0.6    Studies/Results: Dg Abd 1 View 06/27/2013     Status post colonoscopy, no volvulus identified at colonoscopy.   FINDINGS: Contrast medium observed in the distal transverse colon, descending colon, and distal sigmoid colon and rectum. There is a paucity of contrast in the moderately distended and redundant part of the sigmoid colon which extends in the right abdomen. Ms. Part of the sigmoid colon is distended  with gas.  There are stacked gas-filled loops of borderline dilated small bowel in the left abdomen. Formed stool is present in the ascending colon.  IMPRESSION: 1. Mildly distended redundant sigmoid colon in the right abdomen. Contrast medium has flowed proximal to this, into the descending and transverse colon, indicating that any obstruction that was previously present is partial, intermittent, or result. Mildly dilated gas-filled loops of small bowel in the left abdomen are nonspecific and could be from ileus. The descending colon is not distended currently.   Electronically Signed   By: Sherryl Barters M.D.   On: 06/27/2013 17:15   US Abdomen Complete 06/27/2013   FINDINGS: Gallbladder:  No gallstones or wall thickening visualized. No sonographic Murphy sign noted.  Common bile duct:  Diameter: Not visualized secondary to bowel gas.  Liver:  Poorly delineated secondary to habitus and bowel gas. Findings suggestive of diffuse fatty infiltration.  IVC:  Not visualized secondary to habitus and bowel gas. Not visualized secondary to habitus and bowel gas.  Pancreas:  Visualized portion unremarkable.  Spleen:  Size and appearance within normal limits.  Right Kidney:  Length: 11.6 cm. Echogenicity within normal limits. No mass or hydronephrosis visualized.  Left Kidney:  Length: 12.1 cm. Echogenicity within normal limits.  No mass or hydronephrosis visualized.  Abdominal aorta:  Not well delineated secondary to habitus and bowel gas.  IMPRESSION:  No gallstones detected.  Fatty liver suspected.  Poorly delineated pancreas, inferior vena cava, abdominal aorta and portions of liver secondary to habitus and bowel gas   Electronically Signed   By: Chauncey Cruel M.D.   On: 06/27/2013 03:51   Ct Abdomen Pelvis W Contrast 06/27/2013   FINDINGS: Minimal dependent atelectasis is present bilaterally. The lungs are otherwise clear. The heart size is upper limits of normal coronary artery calcifications are present.  Diffuse  fatty infiltration is present in the liver. No discrete lesions are evident. The spleen is within normal limits. The stomach and duodenum, and pancreas are within normal limits. The common bile duct and gallbladder are normal. The adrenal glands are within normal limits bilaterally. The kidneys and ureters are within normal limits.  The distal sigmoid colon is collapsed. The sigmoid extends into the right upper quadrant where the apex of the loop is dilated and and the more proximal colon is within normal limits. The appendix is visualized and normal.  Atherosclerotic calcifications are present within the abdominal aorta and branch vessels without aneurysm.  The the bone windows are unremarkable.  IMPRESSION: 1. The sigmoid colon stretches to the right upper quadrant and is dilated near its apex concerning for a developing sigmoid volvulus. The sigmoid and is not twisted. 2. No evidence for rupture or free air. 3. Diffuse fatty infiltration of the liver. 4. Atherosclerosis including coronary artery disease.   Electronically Signed   By: Lawrence Santiago M.D.   On: 06/27/2013 07:11   Dg Colon W/cm - Wo/w Kub 06/27/2013  FINDINGS: There is contrast filling the sigmoid colon. The contrast column abruptly terminates in the right upper quadrant with no contrast traversing this area. The appearance is concerning for a sigmoid volvulus.  There is no contrast extravasation.  There is no soft tissue mass.  IMPRESSION: Contrast filling the sigmoid colon with the contrast column abruptly terminated in the right upper quadrant with no contrast traversing this area into the more proximal colon. The appearance is concerning for a sigmoid volvulus. Gastroenterology consultation recommended.   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  At colonoscopy somewhat atypical volvulus encountered, with post procedure x ray showed resolution  and redundant sigmoid.    *  Fatty liver. Mild elevation of transaminases  *  Elevated Lipase. No imaging evidence of biliary or pancreatic pathology.   *  DM 2.    PLAN   *  Waiting on this AMs xray.  *  Check lipase in AM    Azucena Freed  06/28/2013, 8:29 AM Pager: [phone]  GI ATTENDING  Interval history and data reviewed. Patient seen and examined. Agree with H&P as outlined above. Patient with redundant sigmoid colon and sigmoid volvulus. Appears to have had resolution post colonoscopy with possible recurrence. At this point, if recurrent sigmoid volvulus demonstrated, he would be appropriate for surgical management. In this  otherwise healthy 63 year old, the likelihood of recurrence seems high.  Docia Chuck. Geri Seminole., M.D. [facility] of Gastroenterology  ADDENDUM: [place] without significant abnormalities. Patient ambulating. Not particularly tender. Case discussed with Dr. Lucia Gaskins. Agree with diet as tolerated. Management this point per surgery. We're available as needed. Will sign off. Discussed with patient and his male friend. Thank  you

## 2013-06-28 NOTE — Progress Notes (Signed)
[facility] PROGRESS NOTE   KAMRAN COKER IRC:[id number] DOB: 1950-11-17 DOA: 06/27/2013 PCP: Marylene Land, MD  HPI/Subjective: Complains about abdominal distention, mild to moderate pain.  Assessment/Plan: Principal Problem:   Sigmoid volvulus Active Problems:   Essential hypertension, benign   Type II or unspecified type diabetes mellitus with unspecified complication, uncontrolled   Sigmoid volvulus -Seen yesterday by gastroenterology, and general surgery. -Decompression of the sigmoid volvulus done by colonoscopy, Dr. Henrene Pastor. -X-ray from today showed resolution of the sigmoid volvulus. -Right side of the colon appears to be full of stools. -General surgery please advise,? Advance diet/laxatives.  Diabetes mellitus type 2 -Uncontrolled diabetes mellitus type 2 hemoglobin A1c 7.7, which correlate with mean plasma glucose of 174. -Currently n.p.o., SSI, carbohydrate modified diet when diet advanced.  Hypertension -Continue home medications.   Code Status: Full code Family Communication: Plan discussed with the patient. Disposition Plan: Remains inpatient   Consultants:  Gastroenterology  General surgery  Procedures:  None  Antibiotics:  None   Objective: Filed Vitals:   06/28/13 0559  BP: 150/88  Pulse: 83  Temp: 98.8 F (37.1 C)  Resp: 18    Intake/Output Summary (Last 24 hours) at 06/28/13 1055 Last data filed at 06/28/13 1000  Gross per 24 hour  Intake      0 ml  Output    200 ml  Net   -200 ml   Filed Weights   06/27/13 1226  Weight: 94.394 kg (208 lb 1.6 oz)    Exam: General: Alert and awake, oriented x3, not in any acute distress. HEENT: anicteric sclera, pupils reactive to light and accommodation, EOMI CVS: S1-S2 clear, no murmur rubs or gallops Chest: clear to auscultation bilaterally, no wheezing, rales or rhonchi Abdomen: soft nontender, nondistended, normal bowel sounds, no organomegaly Extremities: no cyanosis,  clubbing or edema noted bilaterally Neuro: Cranial nerves II-XII intact, no focal neurological deficits  Data Reviewed: Basic Metabolic Panel:  Recent Labs Lab 06/27/13 0047 06/27/13 1421  NA 139  --   K 4.5  --   CL 102  --   CO2 24  --   GLUCOSE 157*  --   BUN 18  --   CREATININE 1.01 0.97  CALCIUM 10.1  --    Liver Function Tests:  Recent Labs Lab 06/27/13 0047  AST 41*  ALT 56*  ALKPHOS 59  BILITOT 0.6  PROT 6.7  ALBUMIN 4.3    Recent Labs Lab 06/27/13 0047  LIPASE 263*   No results found for this basename: AMMONIA,  in the last 168 hours CBC:  Recent Labs Lab 06/27/13 0047 06/27/13 1421 06/28/13 0550  WBC 6.1 8.6 8.0  NEUTROABS 3.0  --   --   HGB 14.5 14.9 14.4  HCT 41.9 43.6 42.3  MCV 86.7 87.4 86.7  PLT 245 230 231   Cardiac Enzymes: No results found for this basename: CKTOTAL, CKMB, CKMBINDEX, TROPONINI,  in the last 168 hours BNP (last 3 results) No results found for this basename: PROBNP,  in the last 8760 hours CBG:  Recent Labs Lab 06/27/13 1229 06/27/13 1701 06/27/13 2213 06/28/13 0808  GLUCAP 126* 96 115* 134*    Micro No results found for this or any previous visit (from the past 240 hour(s)).   Studies: Dg Ribs Unilateral W/chest Right  06/27/2013   CLINICAL DATA:  Chronic cough and congestion for 3 months. Sharp pain anterior right chest. Increases with motion. Hypertension.  EXAM: RIGHT RIBS AND CHEST - 3+ VIEW  COMPARISON:  06/16/2008 chest x-ray.  FINDINGS: No evidence of right-sided rib fracture or pneumothorax.  Poor inspiration. Central pulmonary vascular prominence. Minimal peribronchial thickening. No segmental consolidation.  No plain film evidence of pulmonary malignancy. If there is a persistent unexplained cough followup imaging may be considered.  Cardiomegaly.  Calcified mildly tortuous aorta.  IMPRESSION: No evidence of right-sided rib fracture or pneumothorax.  Please see above.   Electronically Signed   By: Chauncey Cruel M.D.   On: 06/27/2013 02:16   Dg Abd 1 View  06/28/2013   CLINICAL DATA:  Colonoscopy yesterday  EXAM: ABDOMEN - 1 VIEW  COMPARISON:  06/27/2013  FINDINGS: Gaseous distension of the large bowel loops again noted. There has been decrease in small bowel gas. Enteric contrast material within the colon is again noted.The bowel gas pattern is normal. No radio-opaque calculi or other significant radiographic abnormality are seen.  IMPRESSION: Decrease in gaseous distension of bowel loops.   Electronically Signed   By: Kerby Moors M.D.   On: 06/28/2013 10:17   Dg Abd 1 View  06/27/2013   CLINICAL DATA:  Status post colonoscopy, no volvulus identified at colonoscopy.  EXAM: ABDOMEN - 1 VIEW  COMPARISON:  DG COLON W/CM - WO/W KUB dated 06/27/2013; CT ABD/PELVIS W CM dated 06/27/2013  FINDINGS: Contrast medium observed in the distal transverse colon, descending colon, and distal sigmoid colon and rectum. There is a paucity of contrast in the moderately distended and redundant part of the sigmoid colon which extends in the right abdomen. Ms. Part of the sigmoid colon is distended with gas.  There are stacked gas-filled loops of borderline dilated small bowel in the left abdomen. Formed stool is present in the ascending colon.  IMPRESSION: 1. Mildly distended redundant sigmoid colon in the right abdomen. Contrast medium has flowed proximal to this, into the descending and transverse colon, indicating that any obstruction that was previously present is partial, intermittent, or result. Mildly dilated gas-filled loops of small bowel in the left abdomen are nonspecific and could be from ileus. The descending colon is not distended currently.   Electronically Signed   By: Sherryl Barters M.D.   On: 06/27/2013 17:15   US Abdomen Complete  06/27/2013   CLINICAL DATA:  Chest pain  EXAM: ULTRASOUND ABDOMEN COMPLETE  COMPARISON:  None.  FINDINGS: Gallbladder:  No gallstones or wall thickening visualized. No sonographic  Murphy sign noted.  Common bile duct:  Diameter: Not visualized secondary to bowel gas.  Liver:  Poorly delineated secondary to habitus and bowel gas. Findings suggestive of diffuse fatty infiltration.  IVC:  Not visualized secondary to habitus and bowel gas. Not visualized secondary to habitus and bowel gas.  Pancreas:  Visualized portion unremarkable.  Spleen:  Size and appearance within normal limits.  Right Kidney:  Length: 11.6 cm. Echogenicity within normal limits. No mass or hydronephrosis visualized.  Left Kidney:  Length: 12.1 cm. Echogenicity within normal limits. No mass or hydronephrosis visualized.  Abdominal aorta:  Not well delineated secondary to habitus and bowel gas.  IMPRESSION:  No gallstones detected.  Fatty liver suspected.  Poorly delineated pancreas, inferior vena cava, abdominal aorta and portions of liver secondary to habitus and bowel gas   Electronically Signed   By: Chauncey Cruel M.D.   On: 06/27/2013 03:51   Ct Abdomen Pelvis W Contrast  06/27/2013   CLINICAL DATA:  Sudden onset of right upper quadrant abdominal pain.  EXAM: CT ABDOMEN AND PELVIS WITH CONTRAST  TECHNIQUE:  Multidetector CT imaging of the abdomen and pelvis was performed using the standard protocol following bolus administration of intravenous contrast.  CONTRAST:  133mL OMNIPAQUE IOHEXOL 300 MG/ML  SOLN  COMPARISON:  Abdominal and 06/27/2013.  FINDINGS: Minimal dependent atelectasis is present bilaterally. The lungs are otherwise clear. The heart size is upper limits of normal coronary artery calcifications are present.  Diffuse fatty infiltration is present in the liver. No discrete lesions are evident. The spleen is within normal limits. The stomach and duodenum, and pancreas are within normal limits. The common bile duct and gallbladder are normal. The adrenal glands are within normal limits bilaterally. The kidneys and ureters are within normal limits.  The distal sigmoid colon is collapsed. The sigmoid extends into  the right upper quadrant where the apex of the loop is dilated and and the more proximal colon is within normal limits. The appendix is visualized and normal.  Atherosclerotic calcifications are present within the abdominal aorta and branch vessels without aneurysm.  The the bone windows are unremarkable.  IMPRESSION: 1. The sigmoid colon stretches to the right upper quadrant and is dilated near its apex concerning for a developing sigmoid volvulus. The sigmoid and is not twisted. 2. No evidence for rupture or free air. 3. Diffuse fatty infiltration of the liver. 4. Atherosclerosis including coronary artery disease.   Electronically Signed   By: Lawrence Santiago M.D.   On: 06/27/2013 07:11   Dg Colon W/cm - Wo/w Kub  06/27/2013   CLINICAL DATA:  Evaluate sigmoid volvulus seen on recent CT abdomen 06/27/2013  EXAM: BE WITH CONTRAST - WITHOUT AND WITH KUB  COMPARISON:  CT ABD/PELVIS W CM dated 06/27/2013  FLUOROSCOPY TIME:  1 min 1 second  FINDINGS: There is contrast filling the sigmoid colon. The contrast column abruptly terminates in the right upper quadrant with no contrast traversing this area. The appearance is concerning for a sigmoid volvulus.  There is no contrast extravasation.  There is no soft tissue mass.  IMPRESSION: Contrast filling the sigmoid colon with the contrast column abruptly terminated in the right upper quadrant with no contrast traversing this area into the more proximal colon. The appearance is concerning for a sigmoid volvulus. Gastroenterology consultation recommended.   Electronically Signed   By: Kathreen Devoid   On: 06/27/2013 12:13    Scheduled Meds: . antiseptic oral rinse  15 mL Mouth Rinse q12n4p  . chlorhexidine  15 mL Mouth Rinse BID  . heparin  5,000 Units Subcutaneous 3 times per day  . insulin aspart  0-9 Units Subcutaneous TID WC  . insulin detemir  10 Units Subcutaneous QHS   Continuous Infusions: . 0.45 % NaCl with KCl 20 mEq / L 100 mL/hr at 06/28/13 0948        Time spent: 35 minutes    [facility] A  [facility] Pager [phone] If 7PM-7AM, please contact night-coverage at [email or web address], password [facility] 06/28/2013, 10:55 AM  LOS: 1 day

## 2013-06-28 NOTE — Progress Notes (Signed)
1 Day Post-Op  Subjective: Pt states he had minimal relief yesterday but feels about the same as he did on arrival today.  States that when he coughs the pain is worse, but still only in his RUQ. Feels distension is the same as yesterday.  Passing flatus with a small formed BM this morning. Pt refuses SCDs, Heparin, and Insulin shots.  With minimal amount of pt education, agrees to heparin shots and insulin if "blood sugar gets high".  Was informed his definition of high sugar and the hospitals may be different.  Refuses SCDs secondary to discomfort, but states he will walk as often as he can. Pt denies any calf pain.  Objective: Vital signs in last 24 hours: Temp:  [97.8 F (36.6 C)-98.8 F (37.1 C)] 98.8 F (37.1 C) (03/07 0559) Pulse Rate:  [64-85] 83 (03/07 0559) Resp:  [9-30] 18 (03/07 0559) BP: (131-185)/(71-114) 150/88 mmHg (03/07 0559) SpO2:  [92 %-100 %] 94 % (03/07 0559) Weight:  [208 lb 1.6 oz (94.394 kg)] 208 lb 1.6 oz (94.394 kg) (03/06 1226) Last BM Date: 06/25/13  Intake/Output from previous day: 03/06 0701 - 03/07 0700 In: -  Out: 500 [Urine:500] Intake/Output this shift:    PE: Gen:  Alert, NAD, pleasant Card:  RRR, no M/G/R heard Pulm:  CTA, no W/R/R. IS to 2500, then subsequently 1750. Abd: Tender to RUQ and Right flank.  Still very distended.  Good BS on left side,  Hypoactive and "tinkling" on right side as before.  Tympanic to percussion. Ext:  No SCDs on.  2+ DP pulse, 2+ Radial pulse.  Lab Results:   Recent Labs  06/27/13 1421 06/28/13 0550  WBC 8.6 8.0  HGB 14.9 14.4  HCT 43.6 42.3  PLT 230 231   BMET  Recent Labs  06/27/13 0047 06/27/13 1421  NA 139  --   K 4.5  --   CL 102  --   CO2 24  --   GLUCOSE 157*  --   BUN 18  --   CREATININE 1.01 0.97  CALCIUM 10.1  --    PT/INR No results found for this basename: LABPROT, INR,  in the last 72 hours CMP     Component Value Date/Time   NA 139 06/27/2013 0047   K 4.5 06/27/2013 0047   CL  102 06/27/2013 0047   CO2 24 06/27/2013 0047   GLUCOSE 157* 06/27/2013 0047   BUN 18 06/27/2013 0047   CREATININE 0.97 06/27/2013 1421   CALCIUM 10.1 06/27/2013 0047   PROT 6.7 06/27/2013 0047   ALBUMIN 4.3 06/27/2013 0047   AST 41* 06/27/2013 0047   ALT 56* 06/27/2013 0047   ALKPHOS 59 06/27/2013 0047   BILITOT 0.6 06/27/2013 0047   GFRNONAA 87* 06/27/2013 1421   GFRAA >90 06/27/2013 1421   Lipase     Component Value Date/Time   LIPASE 263* 06/27/2013 0047       Studies/Results: Dg Ribs Unilateral W/chest Right  06/27/2013   CLINICAL DATA:  Chronic cough and congestion for 3 months. Sharp pain anterior right chest. Increases with motion. Hypertension.  EXAM: RIGHT RIBS AND CHEST - 3+ VIEW  COMPARISON:  06/16/2008 chest x-ray.  FINDINGS: No evidence of right-sided rib fracture or pneumothorax.  Poor inspiration. Central pulmonary vascular prominence. Minimal peribronchial thickening. No segmental consolidation.  No plain film evidence of pulmonary malignancy. If there is a persistent unexplained cough followup imaging may be considered.  Cardiomegaly.  Calcified mildly tortuous aorta.  IMPRESSION: No  evidence of right-sided rib fracture or pneumothorax.  Please see above.   Electronically Signed   By: Chauncey Cruel M.D.   On: 06/27/2013 02:16   Dg Abd 1 View  06/27/2013   CLINICAL DATA:  Status post colonoscopy, no volvulus identified at colonoscopy.  EXAM: ABDOMEN - 1 VIEW  COMPARISON:  DG COLON W/CM - WO/W KUB dated 06/27/2013; CT ABD/PELVIS W CM dated 06/27/2013  FINDINGS: Contrast medium observed in the distal transverse colon, descending colon, and distal sigmoid colon and rectum. There is a paucity of contrast in the moderately distended and redundant part of the sigmoid colon which extends in the right abdomen. Ms. Part of the sigmoid colon is distended with gas.  There are stacked gas-filled loops of borderline dilated small bowel in the left abdomen. Formed stool is present in the ascending colon.  IMPRESSION: 1.  Mildly distended redundant sigmoid colon in the right abdomen. Contrast medium has flowed proximal to this, into the descending and transverse colon, indicating that any obstruction that was previously present is partial, intermittent, or result. Mildly dilated gas-filled loops of small bowel in the left abdomen are nonspecific and could be from ileus. The descending colon is not distended currently.   Electronically Signed   By: Sherryl Barters M.D.   On: 06/27/2013 17:15   US Abdomen Complete  06/27/2013   CLINICAL DATA:  Chest pain  EXAM: ULTRASOUND ABDOMEN COMPLETE  COMPARISON:  None.  FINDINGS: Gallbladder:  No gallstones or wall thickening visualized. No sonographic Murphy sign noted.  Common bile duct:  Diameter: Not visualized secondary to bowel gas.  Liver:  Poorly delineated secondary to habitus and bowel gas. Findings suggestive of diffuse fatty infiltration.  IVC:  Not visualized secondary to habitus and bowel gas. Not visualized secondary to habitus and bowel gas.  Pancreas:  Visualized portion unremarkable.  Spleen:  Size and appearance within normal limits.  Right Kidney:  Length: 11.6 cm. Echogenicity within normal limits. No mass or hydronephrosis visualized.  Left Kidney:  Length: 12.1 cm. Echogenicity within normal limits. No mass or hydronephrosis visualized.  Abdominal aorta:  Not well delineated secondary to habitus and bowel gas.  IMPRESSION:  No gallstones detected.  Fatty liver suspected.  Poorly delineated pancreas, inferior vena cava, abdominal aorta and portions of liver secondary to habitus and bowel gas   Electronically Signed   By: Chauncey Cruel M.D.   On: 06/27/2013 03:51   Ct Abdomen Pelvis W Contrast  06/27/2013   CLINICAL DATA:  Sudden onset of right upper quadrant abdominal pain.  EXAM: CT ABDOMEN AND PELVIS WITH CONTRAST  TECHNIQUE: Multidetector CT imaging of the abdomen and pelvis was performed using the standard protocol following bolus administration of intravenous  contrast.  CONTRAST:  147mL OMNIPAQUE IOHEXOL 300 MG/ML  SOLN  COMPARISON:  Abdominal and 06/27/2013.  FINDINGS: Minimal dependent atelectasis is present bilaterally. The lungs are otherwise clear. The heart size is upper limits of normal coronary artery calcifications are present.  Diffuse fatty infiltration is present in the liver. No discrete lesions are evident. The spleen is within normal limits. The stomach and duodenum, and pancreas are within normal limits. The common bile duct and gallbladder are normal. The adrenal glands are within normal limits bilaterally. The kidneys and ureters are within normal limits.  The distal sigmoid colon is collapsed. The sigmoid extends into the right upper quadrant where the apex of the loop is dilated and and the more proximal colon is within normal limits. The appendix  is visualized and normal.  Atherosclerotic calcifications are present within the abdominal aorta and branch vessels without aneurysm.  The the bone windows are unremarkable.  IMPRESSION: 1. The sigmoid colon stretches to the right upper quadrant and is dilated near its apex concerning for a developing sigmoid volvulus. The sigmoid and is not twisted. 2. No evidence for rupture or free air. 3. Diffuse fatty infiltration of the liver. 4. Atherosclerosis including coronary artery disease.   Electronically Signed   By: Lawrence Santiago M.D.   On: 06/27/2013 07:11   Dg Colon W/cm - Wo/w Kub  06/27/2013   CLINICAL DATA:  Evaluate sigmoid volvulus seen on recent CT abdomen 06/27/2013  EXAM: BE WITH CONTRAST - WITHOUT AND WITH KUB  COMPARISON:  CT ABD/PELVIS W CM dated 06/27/2013  FLUOROSCOPY TIME:  1 min 1 second  FINDINGS: There is contrast filling the sigmoid colon. The contrast column abruptly terminates in the right upper quadrant with no contrast traversing this area. The appearance is concerning for a sigmoid volvulus.  There is no contrast extravasation.  There is no soft tissue mass.  IMPRESSION: Contrast  filling the sigmoid colon with the contrast column abruptly terminated in the right upper quadrant with no contrast traversing this area into the more proximal colon. The appearance is concerning for a sigmoid volvulus. Gastroenterology consultation recommended.   Electronically Signed   By: Kathreen Devoid   On: 06/27/2013 12:13    Anti-infectives: Anti-infectives   None       Assessment/Plan  Partial small bowel obstruction Sigmoid Volvulus Abdominal distension   Hypertension  DM  HLD  H/o Prostate cancer s/p DaVinci prostatectomy 2010   Plan:  1. Xrays on 06/27/13 s/p colonoscopy showed resolution of volvulus and redundant sigmoid. Pt seems still in pain this morning. Pending repeat KUB this morning 2. Conservative measures including NPO, IVF, pain control, antiemetics  3. Resume Heparin for VTE prophylaxis.  Pt still refuses SCDs. 4. Ambulate and IS.   5. Medicine to manage other comorbidities. Pt initially refusing insulin shots, but with education agreed to if "sugar is high". 6.  May be considered for surgery now as no relief with attempted decompression on 06/27/13. MD to see later today.    LOS: 1 day   Legrand Como A. [place], [place] 06/28/2013, 7:54 AM [facility] Phone #: [phone]  Agree with above Patient looks good, walking hall. KUB looks better - no obvious volvulus, though his original presentation is not typical. Friend, Cammy, with patient.  He works with  [employer] at [employer]. I spent 20 minutes with patient explaining anatomy and potential scenarios.  Would cont NPO for today and start liquids tomorrow, if doing well.  Alphonsa Overall, MD, [facility] Pager: [phone] Office phone:  [phone]

## 2013-06-29 LAB — GLUCOSE, CAPILLARY
Glucose-Capillary: 116 mg/dL — ABNORMAL HIGH (ref 70–99)
Glucose-Capillary: 141 mg/dL — ABNORMAL HIGH (ref 70–99)
Glucose-Capillary: 168 mg/dL — ABNORMAL HIGH (ref 70–99)
Glucose-Capillary: 138 mg/dL — ABNORMAL HIGH (ref 70–99)

## 2013-06-29 LAB — BASIC METABOLIC PANEL
BUN: 14 mg/dL (ref 6–23)
CO2: 25 mEq/L (ref 19–32)
CREATININE: 0.84 mg/dL (ref 0.50–1.35)
Calcium: 9.4 mg/dL (ref 8.4–10.5)
Chloride: 94 mEq/L — ABNORMAL LOW (ref 96–112)
Glucose, Bld: 103 mg/dL — ABNORMAL HIGH (ref 70–99)
Potassium: 4.3 mEq/L (ref 3.7–5.3)
Sodium: 134 mEq/L — ABNORMAL LOW (ref 137–147)

## 2013-06-29 LAB — LIPASE, BLOOD: Lipase: 250 U/L — ABNORMAL HIGH (ref 11–59)

## 2013-06-29 MED ORDER — DOCUSATE SODIUM 100 MG PO CAPS
100.0000 mg | ORAL_CAPSULE | Freq: Two times a day (BID) | ORAL | Status: DC
  Administered 2013-06-29: 100 mg via ORAL
  Filled 2013-06-29: qty 1

## 2013-06-29 MED ORDER — SORBITOL 70 % SOLN
100.0000 mL | Freq: Once | Status: AC
  Administered 2013-06-29: 100 mL via ORAL
  Filled 2013-06-29 (×2): qty 120

## 2013-06-29 MED ORDER — SALINE SPRAY 0.65 % NA SOLN
1.0000 | NASAL | Status: DC | PRN
  Filled 2013-06-29: qty 44

## 2013-06-29 NOTE — Progress Notes (Signed)
PROGRESS NOTE  Donald Hanson [id number] DOB: 30-Oct-1950 DOA: 06/27/2013 PCP: Marylene Land, MD  Subjective: 63 y.o. Male admitted two days ago for sigmoid volvulus. Patient reports no pain today, had mild epistaxis.  Assessment/Plan:  Sigmoid volvulus -Seen 3/6 by gastroenterology, and general surgery.  -Decompression of the sigmoid volvulus done by colonoscopy, Dr. Henrene Pastor.  -X-ray from yesterday showed resolution of the sigmoid volvulus.  -Pt reported small bowel movement yesterday afternoon and passing of flatus throughout the day. -Advance diet to clear liquids, c/w pain medication prn and laxative  Elevated Lipase -Repeat lipase this am trending down at 250. -His CT scan of abdomen/pelvis did not show pancreatitis.  Essential hypertension, benign -Continue home medications.  Type II or unspecified type diabetes mellitus with unspecified complication, uncontrolled -Uncontrolled diabetes mellitus type 2 hemoglobin A1c 7.7, which correlate with mean plasma glucose of 174.  -Currently clear liquids, SSI, carbohydrate modified diet when diet advanced.   DVT Prophylaxis:  Heparin, patient denied SCD's but has been counseled on ambulating while in hospital  Code Status: full Family Communication: plan discussed with patient Disposition Plan: home when medically appropriate   Consultants:  Gastroenterology  General Surgery  Procedures:  none  Antibiotics:  none  Objective: Filed Vitals:   06/28/13 0559 06/28/13 1352 06/28/13 2038 06/29/13 0432  BP: 150/88 169/92 158/87 154/92  Pulse: 83 90 86 86  Temp: 98.8 F (37.1 C) 98.2 F (36.8 C) 98.3 F (36.8 C) 98.3 F (36.8 C)  TempSrc: Oral Oral Oral Oral  Resp: 18 18 18 18   Height:      Weight:      SpO2: 94% 94% 95% 95%    Intake/Output Summary (Last 24 hours) at 06/29/13 0841 Last data filed at 06/29/13 0700  Gross per 24 hour  Intake   2400 ml  Output   1900 ml  Net    500 ml   Filed Weights   06/27/13 1226  Weight: 94.394 kg (208 lb 1.6 oz)    Exam: General: Well developed, well nourished, NAD, up and walking around hallways Cardiovascular: RRR, S1 S2 auscultated, no rubs, murmurs or gallops.   Respiratory: Clear to auscultation bilaterally with equal chest rise  Abdomen: Soft, nontender, mildly distended, + bowel sounds in all 4 quadrants Extremities: warm dry without cyanosis clubbing or edema.  Neuro: AAOx3, cranial nerves grossly intact. Strength 5/5 in upper and lower extremities  Skin: Without rashes exudates or nodules.   Psych: Normal affect and demeanor with intact judgement and insight   Data Reviewed: Basic Metabolic Panel:  Recent Labs Lab 06/27/13 0047 06/27/13 1421 06/29/13 0653  NA 139  --  134*  K 4.5  --  4.3  CL 102  --  94*  CO2 24  --  25  GLUCOSE 157*  --  103*  BUN 18  --  14  CREATININE 1.01 0.97 0.84  CALCIUM 10.1  --  9.4   Liver Function Tests:  Recent Labs Lab 06/27/13 0047  AST 41*  ALT 56*  ALKPHOS 59  BILITOT 0.6  PROT 6.7  ALBUMIN 4.3    Recent Labs Lab 06/27/13 0047 06/29/13 0653  LIPASE 263* 250*   CBC:  Recent Labs Lab 06/27/13 0047 06/27/13 1421 06/28/13 0550  WBC 6.1 8.6 8.0  NEUTROABS 3.0  --   --   HGB 14.5 14.9 14.4  HCT 41.9 43.6 42.3  MCV 86.7 87.4 86.7  PLT 245 230 231   CBG:  Recent Labs Lab  06/28/13 0808 06/28/13 1144 06/28/13 1731 06/28/13 2103 06/29/13 0650  GLUCAP 134* 122* 120* 110* 116*    Studies: Dg Abd 1 View  06/28/2013   CLINICAL DATA:  Colonoscopy yesterday  EXAM: ABDOMEN - 1 VIEW  COMPARISON:  06/27/2013  FINDINGS: Gaseous distension of the large bowel loops again noted. There has been decrease in small bowel gas. Enteric contrast material within the colon is again noted.The bowel gas pattern is normal. No radio-opaque calculi or other significant radiographic abnormality are seen.  IMPRESSION: Decrease in gaseous distension of bowel loops.   Electronically Signed   By:  Kerby Moors M.D.   On: 06/28/2013 10:17   Dg Abd 1 View  06/27/2013   CLINICAL DATA:  Status post colonoscopy, no volvulus identified at colonoscopy.  EXAM: ABDOMEN - 1 VIEW  COMPARISON:  DG COLON W/CM - WO/W KUB dated 06/27/2013; CT ABD/PELVIS W CM dated 06/27/2013  FINDINGS: Contrast medium observed in the distal transverse colon, descending colon, and distal sigmoid colon and rectum. There is a paucity of contrast in the moderately distended and redundant part of the sigmoid colon which extends in the right abdomen. Ms. Part of the sigmoid colon is distended with gas.  There are stacked gas-filled loops of borderline dilated small bowel in the left abdomen. Formed stool is present in the ascending colon.  IMPRESSION: 1. Mildly distended redundant sigmoid colon in the right abdomen. Contrast medium has flowed proximal to this, into the descending and transverse colon, indicating that any obstruction that was previously present is partial, intermittent, or result. Mildly dilated gas-filled loops of small bowel in the left abdomen are nonspecific and could be from ileus. The descending colon is not distended currently.   Electronically Signed   By: Sherryl Barters M.D.   On: 06/27/2013 17:15   Dg Colon W/cm - Wo/w Kub  06/27/2013   CLINICAL DATA:  Evaluate sigmoid volvulus seen on recent CT abdomen 06/27/2013  EXAM: BE WITH CONTRAST - WITHOUT AND WITH KUB  COMPARISON:  CT ABD/PELVIS W CM dated 06/27/2013  FLUOROSCOPY TIME:  1 min 1 second  FINDINGS: There is contrast filling the sigmoid colon. The contrast column abruptly terminates in the right upper quadrant with no contrast traversing this area. The appearance is concerning for a sigmoid volvulus.  There is no contrast extravasation.  There is no soft tissue mass.  IMPRESSION: Contrast filling the sigmoid colon with the contrast column abruptly terminated in the right upper quadrant with no contrast traversing this area into the more proximal colon. The  appearance is concerning for a sigmoid volvulus. Gastroenterology consultation recommended.   Electronically Signed   By: Kathreen Devoid   On: 06/27/2013 12:13    Scheduled Meds: . antiseptic oral rinse  15 mL Mouth Rinse q12n4p  . chlorhexidine  15 mL Mouth Rinse BID  . heparin  5,000 Units Subcutaneous 3 times per day  . insulin aspart  0-9 Units Subcutaneous TID WC  . insulin detemir  10 Units Subcutaneous QHS   Continuous Infusions: . 0.45 % NaCl with KCl 20 mEq / L 100 mL/hr at 06/29/13 [place], PA-S   [facility] Pager [phone]. If 7PM-7AM, please contact night-coverage at [email or web address], password [email or web address] 06/29/2013, 8:41 AM  LOS: 2 days    Addendum  Patient seen and examined, chart and data base reviewed.  I agree with the above assessment and plan.  For full details please see Mrs. [employer], PA-S note.   Loria Lacina  Delice Lesch, MD [facility] Pager: [phone] 06/29/2013, 12:01 PM

## 2013-06-29 NOTE — Progress Notes (Signed)
2 Days Post-Op  PCP:  Marylene Land, MD  Subjective: Pt feels much better.  Pain nearly resolved.  No N/V.  Having BM's and flatus.  Wants to eat.  Ambulating well.    Objective: Vital signs in last 24 hours: Temp:  [98.2 F (36.8 C)-98.3 F (36.8 C)] 98.3 F (36.8 C) (03/08 0432) Pulse Rate:  [86-90] 86 (03/08 0432) Resp:  [18] 18 (03/08 0432) BP: (154-169)/(87-92) 154/92 mmHg (03/08 0432) SpO2:  [94 %-95 %] 95 % (03/08 0432) Last BM Date: 06/28/13  Intake/Output from previous day: 03/07 0701 - 03/08 0700 In: 2400 [I.V.:2400] Out: 1900 [Urine:1900] Intake/Output this shift:    PE: Gen:  Alert, NAD, pleasant Abd: Obese, soft, NT/ND, +BS, no HSM   Lab Results:   Recent Labs  06/27/13 1421 06/28/13 0550  WBC 8.6 8.0  HGB 14.9 14.4  HCT 43.6 42.3  PLT 230 231   BMET  Recent Labs  06/27/13 0047 06/27/13 1421  NA 139  --   K 4.5  --   CL 102  --   CO2 24  --   GLUCOSE 157*  --   BUN 18  --   CREATININE 1.01 0.97  CALCIUM 10.1  --    PT/INR No results found for this basename: LABPROT, INR,  in the last 72 hours CMP     Component Value Date/Time   NA 139 06/27/2013 0047   K 4.5 06/27/2013 0047   CL 102 06/27/2013 0047   CO2 24 06/27/2013 0047   GLUCOSE 157* 06/27/2013 0047   BUN 18 06/27/2013 0047   CREATININE 0.97 06/27/2013 1421   CALCIUM 10.1 06/27/2013 0047   PROT 6.7 06/27/2013 0047   ALBUMIN 4.3 06/27/2013 0047   AST 41* 06/27/2013 0047   ALT 56* 06/27/2013 0047   ALKPHOS 59 06/27/2013 0047   BILITOT 0.6 06/27/2013 0047   GFRNONAA 87* 06/27/2013 1421   GFRAA >90 06/27/2013 1421   Lipase     Component Value Date/Time   LIPASE 263* 06/27/2013 0047       Studies/Results: Dg Abd 1 View  06/28/2013   CLINICAL DATA:  Colonoscopy yesterday  EXAM: ABDOMEN - 1 VIEW  COMPARISON:  06/27/2013  FINDINGS: Gaseous distension of the large bowel loops again noted. There has been decrease in small bowel gas. Enteric contrast material within the colon is again noted.The bowel gas  pattern is normal. No radio-opaque calculi or other significant radiographic abnormality are seen.  IMPRESSION: Decrease in gaseous distension of bowel loops.   Electronically Signed   By: Kerby Moors M.D.   On: 06/28/2013 10:17   Dg Abd 1 View  06/27/2013   CLINICAL DATA:  Status post colonoscopy, no volvulus identified at colonoscopy.  EXAM: ABDOMEN - 1 VIEW  COMPARISON:  DG COLON W/CM - WO/W KUB dated 06/27/2013; CT ABD/PELVIS W CM dated 06/27/2013  FINDINGS: Contrast medium observed in the distal transverse colon, descending colon, and distal sigmoid colon and rectum. There is a paucity of contrast in the moderately distended and redundant part of the sigmoid colon which extends in the right abdomen. Ms. Part of the sigmoid colon is distended with gas.  There are stacked gas-filled loops of borderline dilated small bowel in the left abdomen. Formed stool is present in the ascending colon.  IMPRESSION: 1. Mildly distended redundant sigmoid colon in the right abdomen. Contrast medium has flowed proximal to this, into the descending and transverse colon, indicating that any obstruction that was previously present is  partial, intermittent, or result. Mildly dilated gas-filled loops of small bowel in the left abdomen are nonspecific and could be from ileus. The descending colon is not distended currently.   Electronically Signed   By: Sherryl Barters M.D.   On: 06/27/2013 17:15   Dg Colon W/cm - Wo/w Kub  06/27/2013   CLINICAL DATA:  Evaluate sigmoid volvulus seen on recent CT abdomen 06/27/2013  EXAM: BE WITH CONTRAST - WITHOUT AND WITH KUB  COMPARISON:  CT ABD/PELVIS W CM dated 06/27/2013  FLUOROSCOPY TIME:  1 min 1 second  FINDINGS: There is contrast filling the sigmoid colon. The contrast column abruptly terminates in the right upper quadrant with no contrast traversing this area. The appearance is concerning for a sigmoid volvulus.  There is no contrast extravasation.  There is no soft tissue mass.   IMPRESSION: Contrast filling the sigmoid colon with the contrast column abruptly terminated in the right upper quadrant with no contrast traversing this area into the more proximal colon. The appearance is concerning for a sigmoid volvulus. Gastroenterology consultation recommended.   Electronically Signed   By: Kathreen Devoid   On: 06/27/2013 12:13    Anti-infectives: Anti-infectives   None       Assessment/Plan Partial small bowel obstruction   Sigmoid Volvulus (has peculiar elongation of the sigmoid colon0 Abdominal distension  Epistaxis Hypertension  DM  HLD  H/o Prostate cancer s/p DaVinci prostatectomy 2010   Plan:  1. Clinically and radiographically improving 2. Conservative measures including (red) IVF, pain control, antiemetics  3. Heparin for VTE prophylaxis. Pt still refuses SCDs.  4. Ambulate and IS.  5. Medicine to manage other comorbidities.  6. Does not appear to need surgical intervention at this point.  Advance diet slowly today and tomorrow.  If all goes well can hopefully d/c home on soft diet for a few weeks and a good bowel regimen. 7.  Will need to follow up with Dr. Redmond Pulling in a few weeks to discuss possible elective surgery to remove part of his redundant colon to prevent this from occuring in the future.   8. Instructed nursing on placing gauze soaked in Vaseline jelly in nose to help with epistaxis and use ocean nose spray to moisten the nose to prevent further bleeding    LOS: 2 days    DORT, MEGAN 06/29/2013, 7:40 AM Pager: [phone]  Agree with above. Clearly better.  Dr. Sandi Mariscal, his PCP, came by and visited him yesterday.  Alphonsa Overall, MD, [facility] Pager: [phone] Office phone:  [phone]

## 2013-06-30 ENCOUNTER — Encounter (HOSPITAL_COMMUNITY): Payer: Self-pay | Admitting: Internal Medicine

## 2013-06-30 LAB — BASIC METABOLIC PANEL
BUN: 15 mg/dL (ref 6–23)
CO2: 29 meq/L (ref 19–32)
CREATININE: 1.03 mg/dL (ref 0.50–1.35)
Calcium: 9.6 mg/dL (ref 8.4–10.5)
Chloride: 101 mEq/L (ref 96–112)
GFR calc Af Amer: 89 mL/min — ABNORMAL LOW (ref >90)
GFR calc non Af Amer: 76 mL/min — ABNORMAL LOW (ref >90)
Glucose, Bld: 127 mg/dL — ABNORMAL HIGH (ref 70–99)
Potassium: 4.8 mEq/L (ref 3.7–5.3)
Sodium: 140 mEq/L (ref 137–147)

## 2013-06-30 LAB — GLUCOSE, CAPILLARY: GLUCOSE-CAPILLARY: 135 mg/dL — AB (ref 70–99)

## 2013-06-30 MED ORDER — DOCUSATE SODIUM 100 MG PO CAPS: 100.0000 mg | ORAL_CAPSULE | Freq: Every day | ORAL | Status: DC

## 2013-06-30 MED ORDER — POLYETHYLENE GLYCOL 3350 17 G PO PACK: 17.0000 g | PACK | Freq: Every day | ORAL | Status: DC

## 2013-06-30 NOTE — Discharge Summary (Signed)
Physician Discharge Summary  Donald Hanson YIR:[id number] DOB: 1951-02-27 DOA: 06/27/2013  PCP: Donald Land, MD  Admit date: 06/27/2013 Discharge date: 06/30/2013  Time spent: 30 minutes  Recommendations for Outpatient Follow-up:  1. Follow up with [facility] 4 weeks to discuss elective surgery. 2. Follow up with PCP on volvulus and elevated lipase.  Discharge Diagnoses:  Sigmoid volvulus Essential hypertension, benign  Type II or unspecified type diabetes mellitus with unspecified complication, uncontrolled  History of present illness:  63 y.o. Male presented to ED on 06/27/13 with RUQ abdominal pain and abdominal distention that progressively worsened. He complained of nausea and last bowel movement was prior to admission.  Hospital Course:  CT scan of abdomen and pelvis showed a sigmoid volvulus. Barium study done and was unsuccessful at reducing volvulus. Colonoscopy done next and reduced volvulus. Xray done 06/27/13 s/p colonoscopy confirmed resolution of volvulus. During his hospital stay, he was given Tylenol and Dilaudid prn for pain, sorbitol for constipation, heparin for DVT prophylaxis. He was on SSI for blood sugar control and apresoline for HTN control.  Sigmoid volvulus  -Seen 3/6 by gastroenterology, and general surgery.  -Decompression of the sigmoid volvulus done by colonoscopy, Dr. Henrene Pastor.  -X-ray on 3/8 showed resolution of the sigmoid volvulus.  -Pt reported 2 bowel movements yesterday afternoon and one this morning after 1 dose of sorbitol last night.  Elevated Lipase  -Lipase was 263 and went down to 250. -His CT scan of abdomen/pelvis did not show any evidence of acute pancreatitis.   Essential hypertension, benign  -Continue home medications.   Type II or unspecified type diabetes mellitus with unspecified complication, uncontrolled  -Uncontrolled diabetes mellitus type 2 hemoglobin A1c 7.7, which correlate with mean plasma glucose of 174.   -Continue home medications.   Discharge Condition: stable  Diet recommendation: heart healthy, carb modified  Filed Weights   06/27/13 1226  Weight: 94.394 kg (208 lb 1.6 oz)     Procedures:  none  Consultations:  Gastroenterology  General Surgery  Discharge Exam: Filed Vitals:   06/30/13 0518  BP: 154/81  Pulse: 79  Temp: 98.1 F (36.7 C)  Resp: 18    General: Well developed, well nourished, NAD, up and walking around hallways  Cardiovascular: RRR, S1 S2 auscultated, no rubs, murmurs or gallops.  Respiratory: Clear to auscultation bilaterally with equal chest rise  Abdomen: Soft, nontender, + bowel sounds in all 4 quadrants  Extremities: warm dry without cyanosis clubbing or edema.  Neuro: AAOx3, cranial nerves grossly intact. Strength 5/5 in upper and lower extremities  Skin: Without rashes exudates or nodules.  Psych: Normal affect and demeanor with intact judgement and insight   Discharge Instructions      Discharge Orders   Future Appointments Provider Department Dept Phone   07/23/2013 8:00 AM Gatha Mayer, MD [place] [phone]   08/01/2013 2:00 PM [name], MD [facility], [place] [phone]   Future Orders Complete By Expires   Diet - low sodium heart healthy  As directed    Increase activity slowly  As directed        Medication List         atorvastatin 80 MG tablet  Commonly known as:  LIPITOR  Take 80 mg by mouth daily.     docusate sodium 100 MG capsule  Commonly known as:  COLACE  Take 1 capsule (100 mg total) by mouth at bedtime.     Fish Oil 1000 MG  Cpdr  Take by mouth 2 (two) times daily.     glipiZIDE 5 MG 24 hr tablet  Commonly known as:  GLUCOTROL XL  Take 5 mg by mouth 2 (two) times daily.     HYDROcodone-acetaminophen 10-325 MG per tablet  Commonly known as:  NORCO  Take 1 tablet by mouth every 6 (six) hours as needed for moderate pain or severe pain.     lisinopril 40  MG tablet  Commonly known as:  PRINIVIL,ZESTRIL  Take 1 tablet by mouth daily.     metFORMIN 500 MG (MOD) 24 hr tablet  Commonly known as:  GLUMETZA  Take 500 mg by mouth 2 (two) times daily with a meal. Takes 2 tablets twice daily     polyethylene glycol packet  Commonly known as:  MIRALAX / GLYCOLAX  Take 17 g by mouth daily.     sitaGLIPtin 100 MG tablet  Commonly known as:  JANUVIA  Take 100 mg by mouth daily.     Vitamin D (Cholecalciferol) 1000 UNITS Tabs  Take by mouth 2 (two) times daily.       No Known Allergies Follow-up Information   Follow up with [name], MD. Schedule an appointment as soon as possible for a visit in 4 weeks. (For post-hospital follow up and to discuss elective surgery for your colon (after repeat colonsocopy))    Specialty:  General Surgery   Contact information:   [place] [phone]       Follow up with Donald Land, MD In 2 weeks.   Specialty:  Family Medicine   Contact information:   [place] [id number] [phone]        The results of significant diagnostics from this hospitalization (including imaging, microbiology, ancillary and laboratory) are listed below for reference.    Significant Diagnostic Studies: Dg Ribs Unilateral W/chest Right  06/27/2013   CLINICAL DATA:  Chronic cough and congestion for 3 months. Sharp pain anterior right chest. Increases with motion. Hypertension.  EXAM: RIGHT RIBS AND CHEST - 3+ VIEW  COMPARISON:  06/16/2008 chest x-ray.  FINDINGS: No evidence of right-sided rib fracture or pneumothorax.  Poor inspiration. Central pulmonary vascular prominence. Minimal peribronchial thickening. No segmental consolidation.  No plain film evidence of pulmonary malignancy. If there is a persistent unexplained cough followup imaging may be considered.  Cardiomegaly.  Calcified mildly tortuous aorta.  IMPRESSION: No evidence of right-sided rib  fracture or pneumothorax.  Please see above.   Electronically Signed   By: Chauncey Cruel M.D.   On: 06/27/2013 02:16   Dg Abd 1 View  06/28/2013   CLINICAL DATA:  Colonoscopy yesterday  EXAM: ABDOMEN - 1 VIEW  COMPARISON:  06/27/2013  FINDINGS: Gaseous distension of the large bowel loops again noted. There has been decrease in small bowel gas. Enteric contrast material within the colon is again noted.The bowel gas pattern is normal. No radio-opaque calculi or other significant radiographic abnormality are seen.  IMPRESSION: Decrease in gaseous distension of bowel loops.   Electronically Signed   By: Kerby Moors M.D.   On: 06/28/2013 10:17   Dg Abd 1 View  06/27/2013   CLINICAL DATA:  Status post colonoscopy, no volvulus identified at colonoscopy.  EXAM: ABDOMEN - 1 VIEW  COMPARISON:  DG COLON W/CM - WO/W KUB dated 06/27/2013; CT ABD/PELVIS W CM dated 06/27/2013  FINDINGS: Contrast medium observed in the distal transverse colon, descending colon, and distal sigmoid  colon and rectum. There is a paucity of contrast in the moderately distended and redundant part of the sigmoid colon which extends in the right abdomen. Ms. Part of the sigmoid colon is distended with gas.  There are stacked gas-filled loops of borderline dilated small bowel in the left abdomen. Formed stool is present in the ascending colon.  IMPRESSION: 1. Mildly distended redundant sigmoid colon in the right abdomen. Contrast medium has flowed proximal to this, into the descending and transverse colon, indicating that any obstruction that was previously present is partial, intermittent, or result. Mildly dilated gas-filled loops of small bowel in the left abdomen are nonspecific and could be from ileus. The descending colon is not distended currently.   Electronically Signed   By: Sherryl Barters M.D.   On: 06/27/2013 17:15   US Abdomen Complete  06/27/2013   CLINICAL DATA:  Chest pain  EXAM: ULTRASOUND ABDOMEN COMPLETE  COMPARISON:  None.   FINDINGS: Gallbladder:  No gallstones or wall thickening visualized. No sonographic Murphy sign noted.  Common bile duct:  Diameter: Not visualized secondary to bowel gas.  Liver:  Poorly delineated secondary to habitus and bowel gas. Findings suggestive of diffuse fatty infiltration.  IVC:  Not visualized secondary to habitus and bowel gas. Not visualized secondary to habitus and bowel gas.  Pancreas:  Visualized portion unremarkable.  Spleen:  Size and appearance within normal limits.  Right Kidney:  Length: 11.6 cm. Echogenicity within normal limits. No mass or hydronephrosis visualized.  Left Kidney:  Length: 12.1 cm. Echogenicity within normal limits. No mass or hydronephrosis visualized.  Abdominal aorta:  Not well delineated secondary to habitus and bowel gas.  IMPRESSION:  No gallstones detected.  Fatty liver suspected.  Poorly delineated pancreas, inferior vena cava, abdominal aorta and portions of liver secondary to habitus and bowel gas   Electronically Signed   By: Chauncey Cruel M.D.   On: 06/27/2013 03:51   Ct Abdomen Pelvis W Contrast  06/27/2013   CLINICAL DATA:  Sudden onset of right upper quadrant abdominal pain.  EXAM: CT ABDOMEN AND PELVIS WITH CONTRAST  TECHNIQUE: Multidetector CT imaging of the abdomen and pelvis was performed using the standard protocol following bolus administration of intravenous contrast.  CONTRAST:  174mL OMNIPAQUE IOHEXOL 300 MG/ML  SOLN  COMPARISON:  Abdominal and 06/27/2013.  FINDINGS: Minimal dependent atelectasis is present bilaterally. The lungs are otherwise clear. The heart size is upper limits of normal coronary artery calcifications are present.  Diffuse fatty infiltration is present in the liver. No discrete lesions are evident. The spleen is within normal limits. The stomach and duodenum, and pancreas are within normal limits. The common bile duct and gallbladder are normal. The adrenal glands are within normal limits bilaterally. The kidneys and ureters are  within normal limits.  The distal sigmoid colon is collapsed. The sigmoid extends into the right upper quadrant where the apex of the loop is dilated and and the more proximal colon is within normal limits. The appendix is visualized and normal.  Atherosclerotic calcifications are present within the abdominal aorta and branch vessels without aneurysm.  The the bone windows are unremarkable.  IMPRESSION: 1. The sigmoid colon stretches to the right upper quadrant and is dilated near its apex concerning for a developing sigmoid volvulus. The sigmoid and is not twisted. 2. No evidence for rupture or free air. 3. Diffuse fatty infiltration of the liver. 4. Atherosclerosis including coronary artery disease.   Electronically Signed   By: Lawrence Santiago  M.D.   On: 06/27/2013 07:11   Dg Colon W/cm - Wo/w Kub  06/27/2013   CLINICAL DATA:  Evaluate sigmoid volvulus seen on recent CT abdomen 06/27/2013  EXAM: BE WITH CONTRAST - WITHOUT AND WITH KUB  COMPARISON:  CT ABD/PELVIS W CM dated 06/27/2013  FLUOROSCOPY TIME:  1 min 1 second  FINDINGS: There is contrast filling the sigmoid colon. The contrast column abruptly terminates in the right upper quadrant with no contrast traversing this area. The appearance is concerning for a sigmoid volvulus.  There is no contrast extravasation.  There is no soft tissue mass.  IMPRESSION: Contrast filling the sigmoid colon with the contrast column abruptly terminated in the right upper quadrant with no contrast traversing this area into the more proximal colon. The appearance is concerning for a sigmoid volvulus. Gastroenterology consultation recommended.   Electronically Signed   By: Kathreen Devoid   On: 06/27/2013 12:13    Labs: Basic Metabolic Panel:  Recent Labs Lab 06/27/13 0047 06/27/13 1421 06/29/13 0653 06/30/13 0604  NA 139  --  134* 140  K 4.5  --  4.3 4.8  CL 102  --  94* 101  CO2 24  --  25 29  GLUCOSE 157*  --  103* 127*  BUN 18  --  14 15  CREATININE 1.01 0.97  0.84 1.03  CALCIUM 10.1  --  9.4 9.6   Liver Function Tests:  Recent Labs Lab 06/27/13 0047  AST 41*  ALT 56*  ALKPHOS 59  BILITOT 0.6  PROT 6.7  ALBUMIN 4.3    Recent Labs Lab 06/27/13 0047 06/29/13 0653  LIPASE 263* 250*   CBC:  Recent Labs Lab 06/27/13 0047 06/27/13 1421 06/28/13 0550  WBC 6.1 8.6 8.0  NEUTROABS 3.0  --   --   HGB 14.5 14.9 14.4  HCT 41.9 43.6 42.3  MCV 86.7 87.4 86.7  PLT 245 230 231   CBG:  Recent Labs Lab 06/29/13 0650 06/29/13 1153 06/29/13 1703 06/29/13 2216 06/30/13 0753  GLUCAP 116* 141* 168* 138* 135*     Signed:  Estrella Myrtle, PA-S  [facility] 06/30/2013, 10:06 AM   Addendum  Patient seen and examined, chart and data base reviewed.  I agree with the above assessment and plan.  For full details please see Mrs. [employer], PA-S note.  I reviewed and addended the above note as necessary.   Birdie Hopes, MD [facility] Pager: [phone] 06/30/2013, 10:32 AM

## 2013-06-30 NOTE — Discharge Instructions (Signed)
Intestinal Obstruction An intestinal obstruction is a blockage of the intestine. It can be caused by a physical blockage or by a problem of abnormal function of the intestine.  CAUSES   Adhesions from previous surgeries.  Cancer or tumor.  A hernia, which is a condition in which a portion of the bowel bulges out through an opening or weakness in the abdomen. This sometimes squeezes the bowel.  A swallowed object.  Blockage (impaction) with worms is common in third world countries.  A twisting of the bowel or telescoping of a portion of the bowel into another portion (intussusceptions).  Anything that stops food from going through from the stomach to the anus. SYMPTOMS  Symptoms of bowel obstruction may include abdominal bloating, nausea, vomiting, explosive diarrhea, or explosive stool. You may not be able to hear your normal bowel sounds (such as "growling in your stomach"). You may also stop having bowel movements or passing gas. DIAGNOSIS  Usually this condition is diagnosed with a history and an examination. Often, lab studies (blood work) and X-rays may be used to find the cause. TREATMENT  The main treatment for this condition is to rest the intestine. Often, the obstruction may relieve itself and allow the intestine to start working again. Think of the intestine like a balloon that is blown up (filled with trapped food and water that has squeezed into a hole or area that it cannot get through).   If the obstruction is complete, a nasogastric (NG) tube is passed through the nose and into the stomach. It is then connected to suction to keep the stomach emptied out. This also helps treat the nausea and vomiting.  If there is an imbalance in the electrolytes, they are corrected with intravenous fluids. These fluids have the proper chemicals in them to correct the problem.  If the reason for the blockage does not get better with conservative (nonsurgical) treatment, surgery may be  necessary. Sometimes, surgery is done immediately if your surgeon knows that the problem is not going to get better with conservative treatment. PROGNOSIS  Depending on what the problem is, most of these problems can be treated by your caregivers with good results. Your caregiver will discuss with you the best course of action to take. FOLLOWING SURGERY Seek immediate medical attention if you have:  Increasing abdominal pain, repeated vomiting, dehydration, or fainting.  Severe weakness, chest pain, or back pain.  Blood in your vomit or stool.  Tarry stool. Document Released: 07/01/2003 Document Revised: 08/05/2012 Document Reviewed: 11/29/2007 [facility] Patient Information 2014 [place].   Unless you are having diarrhea, take a stool softener everyday.  You may need stool softener twice a day when you are on narcotic pain medication.

## 2013-06-30 NOTE — Progress Notes (Signed)
3 Days Post-Op  Subjective: Pt doing great.  No N/V or abdominal pain.  Distension resolved.  Had 2 BM's yesterday and 1 this morning.  Having good flatus.  Ambulating well.  Wants to go home.  Objective: Vital signs in last 24 hours: Temp:  [98.1 F (36.7 C)-98.4 F (36.9 C)] 98.1 F (36.7 C) (03/09 0518) Pulse Rate:  [79-103] 79 (03/09 0518) Resp:  [18] 18 (03/09 0518) BP: (141-154)/(81-96) 154/81 mmHg (03/09 0518) SpO2:  [96 %] 96 % (03/09 0518) Last BM Date: 06/30/13  Intake/Output from previous day: 03/08 0701 - 03/09 0700 In: 222 [P.O.:222] Out: -  Intake/Output this shift:    PE: Gen:  Alert, NAD, pleasant Abd: Obese, soft, NT/ND, +BS, no HSM, umbilical scar noted from prostate surgery   Lab Results:   Recent Labs  06/27/13 1421 06/28/13 0550  WBC 8.6 8.0  HGB 14.9 14.4  HCT 43.6 42.3  PLT 230 231   BMET  Recent Labs  06/29/13 0653 06/30/13 0604  NA 134* 140  K 4.3 4.8  CL 94* 101  CO2 25 29  GLUCOSE 103* 127*  BUN 14 15  CREATININE 0.84 1.03  CALCIUM 9.4 9.6   PT/INR No results found for this basename: LABPROT, INR,  in the last 72 hours CMP     Component Value Date/Time   NA 140 06/30/2013 0604   K 4.8 06/30/2013 0604   CL 101 06/30/2013 0604   CO2 29 06/30/2013 0604   GLUCOSE 127* 06/30/2013 0604   BUN 15 06/30/2013 0604   CREATININE 1.03 06/30/2013 0604   CALCIUM 9.6 06/30/2013 0604   PROT 6.7 06/27/2013 0047   ALBUMIN 4.3 06/27/2013 0047   AST 41* 06/27/2013 0047   ALT 56* 06/27/2013 0047   ALKPHOS 59 06/27/2013 0047   BILITOT 0.6 06/27/2013 0047   GFRNONAA 76* 06/30/2013 0604   GFRAA 89* 06/30/2013 0604   Lipase     Component Value Date/Time   LIPASE 250* 06/29/2013 0653       Studies/Results: Dg Abd 1 View  06/28/2013   CLINICAL DATA:  Colonoscopy yesterday  EXAM: ABDOMEN - 1 VIEW  COMPARISON:  06/27/2013  FINDINGS: Gaseous distension of the large bowel loops again noted. There has been decrease in small bowel gas. Enteric contrast material within the  colon is again noted.The bowel gas pattern is normal. No radio-opaque calculi or other significant radiographic abnormality are seen.  IMPRESSION: Decrease in gaseous distension of bowel loops.   Electronically Signed   By: Kerby Moors M.D.   On: 06/28/2013 10:17    Anti-infectives: Anti-infectives   None       Assessment/Plan Partial small bowel obstruction  Sigmoid Volvulus - (has peculiar elongation of the sigmoid colon) Abdominal distension  Hypertension  DM  HLD  H/o Prostate cancer s/p DaVinci prostatectomy 2010   Plan:  1. Clinically and radiographically improving  2. No surgical intervention at this point, ready for discharge if he tolerates the soft diet, and medically ready 3. Heparin for VTE prophylaxis. Pt still refuses SCDs.  4. Ambulate and IS.  5. Will need to follow up with Dr. Redmond Pulling in a few weeks to discuss possible elective surgery to remove part of his redundant colon to prevent this from occuring in the future.  6.  Recommend good bowel regimen once home (stool softeners, laxatives, suppositories as needed)        LOS: 3 days    DORT, Ashlin Hidalgo 06/30/2013, 8:17 AM Pager: [phone]

## 2013-06-30 NOTE — Progress Notes (Signed)
Donald Hanson discharged Home per MD order.  Discharge instructions reviewed and discussed with the patient, all questions and concerns answered. Copy of instructions, care notes for new medicines and scripts given to patient.    Medication List         atorvastatin 80 MG tablet  Commonly known as:  LIPITOR  Take 80 mg by mouth daily.     docusate sodium 100 MG capsule  Commonly known as:  COLACE  Take 1 capsule (100 mg total) by mouth at bedtime.     Fish Oil 1000 MG Cpdr  Take by mouth 2 (two) times daily.     glipiZIDE 5 MG 24 hr tablet  Commonly known as:  GLUCOTROL XL  Take 5 mg by mouth 2 (two) times daily.     HYDROcodone-acetaminophen 10-325 MG per tablet  Commonly known as:  NORCO  Take 1 tablet by mouth every 6 (six) hours as needed for moderate pain or severe pain.     lisinopril 40 MG tablet  Commonly known as:  PRINIVIL,ZESTRIL  Take 1 tablet by mouth daily.     metFORMIN 500 MG (MOD) 24 hr tablet  Commonly known as:  GLUMETZA  Take 500 mg by mouth 2 (two) times daily with a meal. Takes 2 tablets twice daily     polyethylene glycol packet  Commonly known as:  MIRALAX / GLYCOLAX  Take 17 g by mouth daily.     sitaGLIPtin 100 MG tablet  Commonly known as:  JANUVIA  Take 100 mg by mouth daily.     Vitamin D (Cholecalciferol) 1000 UNITS Tabs  Take by mouth 2 (two) times daily.        Patients skin is clean, dry and intact, no evidence of skin break down. IV site discontinued and catheter remains intact. Site without signs and symptoms of complications. Dressing and pressure applied.  Patient escorted to car by volunteers in a wheelchair,  no distress noted upon discharge.  [name], [name] C 06/30/2013 11:34 AM

## 2013-06-30 NOTE — Care Management Note (Signed)
    Page 1 of 1   06/30/2013     10:56:24 AM   CARE MANAGEMENT NOTE 06/30/2013  Patient:  Donald Hanson, Donald Hanson   Account Number:  [id number]  Date Initiated:  06/30/2013  Documentation initiated by:  Tomi Bamberger  Subjective/Objective Assessment:   dx abd pain, sigmoid volvulus  admit- lives alone.     Action/Plan:   Anticipated DC Date:  06/30/2013   Anticipated DC Plan:  [place]  CM consult      Choice offered to / List presented to:             Status of service:  Completed, signed off Medicare Important Message given?   (If response is "NO", the following Medicare IM given date fields will be blank) Date Medicare IM given:   Date Additional Medicare IM given:    Discharge Disposition:  HOME/SELF CARE  Per UR Regulation:  Reviewed for med. necessity/level of care/duration of stay  If discussed at [place] of Stay Meetings, dates discussed:    Comments:

## 2013-06-30 NOTE — Progress Notes (Signed)
I have seen and examined the patient and agree with the assessment and plans.  Skyy Nilan A. Anwar Crill  MD, FACS  

## 2013-07-17 ENCOUNTER — Telehealth: Payer: Self-pay | Admitting: Internal Medicine

## 2013-07-17 DIAGNOSIS — R748 Abnormal levels of other serum enzymes: Secondary | ICD-10-CM

## 2013-07-17 NOTE — Telephone Encounter (Signed)
Lipase elevated when hospitaliized with sigmoid volvulus   Still up and mild amylase elevation also at Dr. Deboraha Sprang office  I told Dr. Sandi Mariscal we would contact Eddie and check macro-enzymes  Pancreas NL on CT so do not think he had pancreatitis

## 2013-07-18 ENCOUNTER — Encounter: Payer: Self-pay | Admitting: Internal Medicine

## 2013-07-18 DIAGNOSIS — R748 Abnormal levels of other serum enzymes: Secondary | ICD-10-CM | POA: Insufficient documentation

## 2013-07-18 HISTORY — DX: Abnormal levels of other serum enzymes: R74.8

## 2013-07-18 NOTE — Telephone Encounter (Signed)
Left message for pt to call back  °

## 2013-07-18 NOTE — Telephone Encounter (Signed)
Pt aware and states he will try to come Monday for the labs.

## 2013-07-18 NOTE — Telephone Encounter (Signed)
Please call patient and let him know he needs a blood test to try to understand why amylase and lipase are elevated   Tell him Dr. Sandi Mariscal and I spoke about this  Could be from the volvulus of the intestine but also might just have large size of the molecules and will be important to know for future if so

## 2013-07-21 ENCOUNTER — Other Ambulatory Visit: Payer: 59

## 2013-07-21 DIAGNOSIS — R748 Abnormal levels of other serum enzymes: Secondary | ICD-10-CM

## 2013-07-23 ENCOUNTER — Ambulatory Visit (AMBULATORY_SURGERY_CENTER): Payer: 59 | Admitting: Internal Medicine

## 2013-07-23 ENCOUNTER — Encounter: Payer: Self-pay | Admitting: Internal Medicine

## 2013-07-23 VITALS — BP 131/81 | HR 73 | Temp 96.9°F | Resp 19 | Ht 70.0 in | Wt 214.0 lb

## 2013-07-23 DIAGNOSIS — Z1211 Encounter for screening for malignant neoplasm of colon: Secondary | ICD-10-CM

## 2013-07-23 DIAGNOSIS — K644 Residual hemorrhoidal skin tags: Secondary | ICD-10-CM

## 2013-07-23 LAB — GLUCOSE, CAPILLARY
Glucose-Capillary: 139 mg/dL — ABNORMAL HIGH (ref 70–99)
Glucose-Capillary: 114 mg/dL — ABNORMAL HIGH (ref 70–99)

## 2013-07-23 MED ORDER — SODIUM CHLORIDE 0.9 % IV SOLN: 500.0000 mL | INTRAVENOUS | Status: DC

## 2013-07-23 NOTE — Progress Notes (Signed)
Lidocaine-40mg IV prior to Propofol InductionPropofol given over incremental dosages 

## 2013-07-23 NOTE — Op Note (Addendum)
[place]  [address] [place], [id number]   COLONOSCOPY PROCEDURE REPORT  PATIENT: Donald Hanson, Donald Hanson  MR#: [id number] BIRTHDATE: 12/29/1950 , 62  yrs. old GENDER: Male ENDOSCOPIST: Gatha Mayer, MD, [facility] PROCEDURE DATE:  07/23/2013 PROCEDURE:   Colonoscopy, screening First Screening Colonoscopy - Avg.  risk and is 50 yrs.  old or older - No.  Prior Negative Screening - Now for repeat screening. 10 or more years since last screening  History of Adenoma - Now for follow-up colonoscopy & has been > or = to 3 yrs.  N/A  Polyps Removed Today? No.  Recommend repeat exam, <10 yrs? No. ASA CLASS:   Class II INDICATIONS:average risk screening and Last colonoscopy performed 10 years ago. MEDICATIONS: propofol (Diprivan) 250mg  IV, MAC sedation, administered by CRNA, and These medications were titrated to patient response per physician's verbal order  DESCRIPTION OF PROCEDURE:   After the risks benefits and alternatives of the procedure were thoroughly explained, informed consent was obtained.  A digital rectal exam revealed external hemorrhoids, A digital rectal exam revealed several skin tags, A digital rectal exam revealed no rectal mass, A digital rectal exam revealed the prostate was absent..   The LB [id number] endoscope was introduced through the anus and advanced to the cecum, which was identified by both the appendix and ileocecal valve. No adverse events experienced.   The quality of the prep was Suprep good  The instrument was then slowly withdrawn as the colon was fully examined.  COLON FINDINGS: The colon was redundant.  Manual abdominal counter-pressure was used to reach the cecum. Forceps used to aid cecal inspection.  The colonic mucosa appeared normal throughout the entire examined colon.  Retroflexed views revealed no abnormalities. The time to cecum=12 minutes 0 seconds.  Withdrawal time=8 minutes 15 seconds.  The scope was withdrawn and  the procedure completed. COMPLICATIONS: There were no complications.  ENDOSCOPIC IMPRESSION: 1.   The colon was redundant 2.   The colonic mucosa appeared normal throughout the entire examined colon - good prep - second screening 3.   External hemorrhoids and anal tags  RECOMMENDATIONS: 1.  Repeat colonoscopy 10 years. 2.   Next step to see Dr.  Redmond Pulling re: surgery to prevent recurrent volvulus 3.  Waiting on amylase isoenzymes ? macroenzymes - elevated lipase and amylase could be from bowel also - pancreas normal on CT   eSigned:  Gatha Mayer, MD, [facility] 07/23/2013 8:45 AM Revised: 07/23/2013 8:45 AM cc: Derinda Late, MD, The Patient, and Greer Pickerel MD

## 2013-07-23 NOTE — Patient Instructions (Addendum)
There were no polyps found. The colon is floppy (redundant) which is part of the volvulus problem. You have hemorrhoids as you know - you can ask Dr. Redmond Pulling about these but first priority is surgery to prevent volvulus from occurring again.  Next routine colonoscopy in 10 years - 2025  I appreciate the opportunity to care for you. Gatha Mayer, MD, FACG YOU HAD AN ENDOSCOPIC PROCEDURE TODAY AT [facility]: Refer to the procedure report that was given to you for any specific questions about what was found during the examination.  If the procedure report does not answer your questions, please call your gastroenterologist to clarify.  If you requested that your care partner not be given the details of your procedure findings, then the procedure report has been included in a sealed envelope for you to review at your convenience later.  YOU SHOULD EXPECT: Some feelings of bloating in the abdomen. Passage of more gas than usual.  Walking can help get rid of the air that was put into your GI tract during the procedure and reduce the bloating. If you had a lower endoscopy (such as a colonoscopy or flexible sigmoidoscopy) you may notice spotting of blood in your stool or on the toilet paper. If you underwent a bowel prep for your procedure, then you may not have a normal bowel movement for a few days.  DIET: Your first meal following the procedure should be a light meal and then it is ok to progress to your normal diet.  A half-sandwich or bowl of soup is an example of a good first meal.  Heavy or fried foods are harder to digest and may make you feel nauseous or bloated.  Likewise meals heavy in dairy and vegetables can cause extra gas to form and this can also increase the bloating.  Drink plenty of fluids but you should avoid alcoholic beverages for 24 hours.  ACTIVITY: Your care partner should take you home directly after the procedure.  You should plan to take it easy, moving slowly for  the rest of the day.  You can resume normal activity the day after the procedure however you should NOT DRIVE or use heavy machinery for 24 hours (because of the sedation medicines used during the test).    SYMPTOMS TO REPORT IMMEDIATELY: A gastroenterologist can be reached at any hour.  During normal business hours, 8:30 AM to 5:00 PM Monday through Friday, call [phone].  After hours and on weekends, please call the GI answering service at [phone] who will take a message and have the physician on call contact you.   Following lower endoscopy (colonoscopy or flexible sigmoidoscopy):  Excessive amounts of blood in the stool  Significant tenderness or worsening of abdominal pains  Swelling of the abdomen that is new, acute  Fever of 100F or higher  Following upper endoscopy (EGD)  Vomiting of blood or coffee ground material  New chest pain or pain under the shoulder blades  Painful or persistently difficult swallowing  New shortness of breath  Fever of 100F or higher  Black, tarry-looking stools  FOLLOW UP: If any biopsies were taken you will be contacted by phone or by letter within the next 1-3 weeks.  Call your gastroenterologist if you have not heard about the biopsies in 3 weeks.  Our staff will call the home number listed on your records the next business day following your procedure to check on you and address any questions or concerns  that you may have at that time regarding the information given to you following your procedure. This is a courtesy call and so if there is no answer at the home number and we have not heard from you through the emergency physician on call, we will assume that you have returned to your regular daily activities without incident.  SIGNATURES/CONFIDENTIALITY: You and/or your care partner have signed paperwork which will be entered into your electronic medical record.  These signatures attest to the fact that that the information above on your  After Visit Summary has been reviewed and is understood.  Full responsibility of the confidentiality of this discharge information lies with you and/or your care-partner.

## 2013-07-24 ENCOUNTER — Telehealth: Payer: Self-pay

## 2013-07-24 ENCOUNTER — Encounter: Payer: Self-pay | Admitting: Internal Medicine

## 2013-07-24 LAB — AMYLASE ISOENZYMES
Amylase.: 73 U/L (ref 21–101)
ISOAMYLASE-PANCREATIC: 70 U/L — AB (ref 16–46)
Salivary Fraction: 3 U/L — ABNORMAL LOW (ref 4–61)

## 2013-07-24 NOTE — Telephone Encounter (Signed)
  Follow up Call-  Call back number 07/23/2013  Post procedure Call Back phone  # [phone]  Permission to leave phone message Yes     Patient questions:  Do you have a fever, pain , or abdominal swelling? no Pain Score  0 *  Have you tolerated food without any problems? yes  Have you been able to return to your normal activities? yes  Do you have any questions about your discharge instructions: Diet   no Medications  no Follow up visit  no  Do you have questions or concerns about your Care? no  Actions: * If pain score is 4 or above: No action needed, pain <4.  Per the pt, "you guys are great".  No problems per the pt. Maw

## 2013-08-01 ENCOUNTER — Ambulatory Visit (INDEPENDENT_AMBULATORY_CARE_PROVIDER_SITE_OTHER): Payer: 59 | Admitting: General Surgery

## 2013-08-01 ENCOUNTER — Encounter (INDEPENDENT_AMBULATORY_CARE_PROVIDER_SITE_OTHER): Payer: Self-pay | Admitting: General Surgery

## 2013-08-01 VITALS — BP 150/82 | HR 82 | Temp 97.8°F | Resp 16 | Ht 70.0 in | Wt 212.6 lb

## 2013-08-01 DIAGNOSIS — K562 Volvulus: Secondary | ICD-10-CM

## 2013-08-01 NOTE — Progress Notes (Signed)
Patient ID: Donald Hanson, male   DOB: 1950/05/23, 63 y.o.   MRN: [id number]  Chief Complaint  Patient presents with  . Colon Polyps    HPI Donald Hanson is a 63 y.o. male.   HPI 63 year old Caucasian male comes in for followup after his recent hospitalization from March 6 through the ninth for acute onset of right-sided abdominal pain. He was found to have a very redundant sigmoid colon and a sigmoid volvulus. He underwent colonoscopic partial decompression. He ultimately resolved without having to have active intervention. He is here today to discuss elective colectomy in order to prevent recurrent sigmoid volvulus. Since discharge from the hospital he states he has been doing well. He denies any fevers or chills. He denies any nausea or vomiting. He denies any abdominal pain. He reports normal bowel movements. He did undergo a colonoscopy last week which was normal except for very redundant colon. Past Medical History  Diagnosis Date  . Diabetes   . Hypertension   . Hyperlipidemia   . Seasonal allergies   . Cancer 2010    prostate cancer  . History of kidney stones   . Elevated amylase and lipase 07/18/2013    Past Surgical History  Procedure Laterality Date  . Prostatectomy  2010  . Nasal fracture  1972  . Kidney stone surgery    . Colonoscopy N/A 06/27/2013    Procedure: COLONOSCOPY;  Surgeon: Irene Shipper, MD;  Location: [facility];  Service: Endoscopy;  Laterality: N/A;    Family History  Problem Relation Age of Onset  . Colon cancer Neg Hx   . Cancer - Ovarian Mother   . Heart disease Mother   . Other Father     Social History History  Substance Use Topics  . Smoking status: Former Smoker    Quit date: 04/24/1968  . Smokeless tobacco: Never Used  . Alcohol Use: 1.2 oz/week    2 Cans of beer per week     Comment: 2  BEERS A WEEK    No Known Allergies  Current Outpatient Prescriptions  Medication Sig Dispense Refill  . atorvastatin (LIPITOR) 80 MG tablet Take  80 mg by mouth daily.      Marland Kitchen glipiZIDE (GLUCOTROL XL) 5 MG 24 hr tablet Take 5 mg by mouth 2 (two) times daily.      Marland Kitchen losartan (COZAAR) 100 MG tablet       . metFORMIN (GLUMETZA) 500 MG (MOD) 24 hr tablet Take 500 mg by mouth 2 (two) times daily with a meal. Takes 2 tablets twice daily      . Omega-3 Fatty Acids (FISH OIL) 1000 MG CPDR Take by mouth 2 (two) times daily.      . sitaGLIPtin (JANUVIA) 100 MG tablet Take 100 mg by mouth daily.      . Vitamin D, Cholecalciferol, 1000 UNITS TABS Take by mouth 2 (two) times daily.      Marland Kitchen HYDROcodone-acetaminophen (NORCO) 10-325 MG per tablet Take 1 tablet by mouth every 6 (six) hours as needed for moderate pain or severe pain.       No current facility-administered medications for this visit.    Review of Systems Review of Systems  Constitutional: Negative for fever, chills, appetite change and unexpected weight change.  HENT: Negative for congestion and trouble swallowing.   Eyes: Negative for visual disturbance.  Respiratory: Negative for chest tightness and shortness of breath.   Cardiovascular: Negative for chest pain and leg swelling.  No PND, no orthopnea, no DOE  Gastrointestinal:       See HPI  Genitourinary: Negative for dysuria, frequency, hematuria and difficulty urinating.  Musculoskeletal: Negative.   Skin: Negative for rash.  Neurological: Negative for seizures and speech difficulty.  Hematological: Does not bruise/bleed easily.  Psychiatric/Behavioral: Negative for behavioral problems and confusion.    Blood pressure 150/82, pulse 82, temperature 97.8 F (36.6 C), temperature source Oral, resp. rate 16, height 5\' 10"  (1.778 m), weight 212 lb 9.6 oz (96.435 kg).  Physical Exam Physical Exam  Constitutional: He is oriented to person, place, and time. He appears well-developed and well-nourished. No distress.  HENT:  Head: Normocephalic and atraumatic.  Right Ear: External ear normal.  Left Ear: External ear normal.    Eyes: Conjunctivae are normal. No scleral icterus.  Neck: Normal range of motion. Neck supple. No tracheal deviation present. No thyromegaly present.  Cardiovascular: Normal rate, normal heart sounds and intact distal pulses.   Pulmonary/Chest: Effort normal and breath sounds normal. No respiratory distress. He has no wheezes.  Abdominal: Soft. He exhibits no distension. There is no tenderness. There is no rebound and no guarding.    Scattered trocar sites; upper midline diastasis. Small fascial defect at umbilicus.   Musculoskeletal: Normal range of motion. He exhibits no edema and no tenderness.  Lymphadenopathy:    He has no cervical adenopathy.  Neurological: He is alert and oriented to person, place, and time. He exhibits normal muscle tone.  Skin: Skin is warm and dry. No rash noted. He is not diaphoretic. No erythema. No pallor.  Psychiatric: He has a normal mood and affect. His behavior is normal. Judgment and thought content normal.    Data Reviewed Hospital records CT scan Dr Celesta Aver colonoscopy April 2015 - redundant colon Labs: amylase 73; isoamylase 70, salivary function 3  Assessment    Redundant sigmoid colon H/o Sigmoid volvulus     Plan    We discussed the hospital findings as well as his colonoscopy report and the CT scan. He does have a very redundant sigmoid colon. We discussed that he is at high risk for recurrent volvulus at some point in the future. We discussed that the ideal plan would be to plan elective resection in order to avoid a colostomy. He has elected to proceed with surgery  I discussed the procedure in detail.  The patient was given Neurosurgeon.  We discussed the risks and benefits of surgery including, but not limited to bleeding, infection (such as wound infection, abdominal abscess), injury to surrounding structures, blood clot formation, urinary retention, incisional hernia, anastomotic stricture, anastomotic leak, anesthesia risks,  pulmonary & cardiac complications such as pneumonia &/or heart attack, need for additional procedures, ileus, & prolonged hospitalization.  We discussed the typical postoperative recovery course, including limitations & restrictions postoperatively. I explained that the likelihood of improvement in their symptoms is good.   He will be with the scheduler today to plan for laparoscopic-assisted colectomy. He was given a prescription for his bowel prep. I will send and at that message to Dr. Carlean Purl asking him about his elevated lipase and amylase levels. His pancreas showed no abnormality on his CT scan. Therefore I think it is safe to proceed with colectomy  Leighton Ruff. Redmond Pulling, MD, FACS General, Bariatric, & Minimally Invasive Surgery [facility]        [name] 08/01/2013, 2:56 PM

## 2013-08-01 NOTE — Patient Instructions (Signed)
Watch EMMI video

## 2013-08-18 ENCOUNTER — Encounter (INDEPENDENT_AMBULATORY_CARE_PROVIDER_SITE_OTHER): Payer: Self-pay

## 2013-08-18 ENCOUNTER — Encounter (HOSPITAL_COMMUNITY): Payer: Self-pay | Admitting: Pharmacy Technician

## 2013-08-18 ENCOUNTER — Encounter (HOSPITAL_COMMUNITY): Payer: Self-pay

## 2013-08-18 ENCOUNTER — Encounter (HOSPITAL_COMMUNITY)
Admission: RE | Admit: 2013-08-18 | Discharge: 2013-08-18 | Disposition: A | Discharge status: Home / Self Care | Payer: 59 | Source: Ambulatory Visit | Attending: General Surgery | Admitting: General Surgery

## 2013-08-18 DIAGNOSIS — Z01812 Encounter for preprocedural laboratory examination: Secondary | ICD-10-CM | POA: Insufficient documentation

## 2013-08-18 HISTORY — DX: Volvulus: K56.2

## 2013-08-18 LAB — COMPREHENSIVE METABOLIC PANEL
ALT: 47 U/L (ref 0–53)
AST: 23 U/L (ref 0–37)
Albumin: 4.5 g/dL (ref 3.5–5.2)
Alkaline Phosphatase: 60 U/L (ref 39–117)
BUN: 13 mg/dL (ref 6–23)
CALCIUM: 9.8 mg/dL (ref 8.4–10.5)
CO2: 26 meq/L (ref 19–32)
CREATININE: 0.99 mg/dL (ref 0.50–1.35)
Chloride: 102 mEq/L (ref 96–112)
GFR, EST NON AFRICAN AMERICAN: 87 mL/min — AB (ref >90)
Glucose, Bld: 99 mg/dL (ref 70–99)
Potassium: 4.3 mEq/L (ref 3.7–5.3)
Sodium: 140 mEq/L (ref 137–147)
TOTAL PROTEIN: 7 g/dL (ref 6.0–8.3)
Total Bilirubin: 0.8 mg/dL (ref 0.3–1.2)

## 2013-08-18 LAB — CBC WITH DIFFERENTIAL/PLATELET
Basophils Absolute: 0 10*3/uL (ref 0.0–0.1)
Basophils Relative: 0 % (ref 0–1)
EOS ABS: 0.1 10*3/uL (ref 0.0–0.7)
EOS PCT: 2 % (ref 0–5)
HEMATOCRIT: 41.7 % (ref 39.0–52.0)
Hemoglobin: 13.9 g/dL (ref 13.0–17.0)
LYMPHS ABS: 2.1 10*3/uL (ref 0.7–4.0)
LYMPHS PCT: 27 % (ref 12–46)
MCH: 28.7 pg (ref 26.0–34.0)
MCHC: 33.3 g/dL (ref 30.0–36.0)
MCV: 86.2 fL (ref 78.0–100.0)
MONO ABS: 0.5 10*3/uL (ref 0.1–1.0)
MONOS PCT: 7 % (ref 3–12)
Neutro Abs: 4.8 10*3/uL (ref 1.7–7.7)
Neutrophils Relative %: 64 % (ref 43–77)
Platelets: 269 10*3/uL (ref 150–400)
RBC: 4.84 MIL/uL (ref 4.22–5.81)
RDW: 13 % (ref 11.5–15.5)
WBC: 7.6 10*3/uL (ref 4.0–10.5)

## 2013-08-18 LAB — LIPASE, BLOOD: LIPASE: 77 U/L — AB (ref 11–59)

## 2013-08-18 NOTE — Pre-Procedure Instructions (Addendum)
EKG AND CXR REPORTS IN EPIC FROM 06/27/13. Pt states he has his bowel prep instructions / prescriptions from Dr. Dois Davenport office.

## 2013-08-18 NOTE — Progress Notes (Signed)
08/18/13 1330  OBSTRUCTIVE SLEEP APNEA  Have you ever been diagnosed with sleep apnea through a sleep study? No  Do you snore loudly (loud enough to be heard through closed doors)?  0  Do you often feel tired, fatigued, or sleepy during the daytime? 0  Has anyone observed you stop breathing during your sleep? 0  Do you have, or are you being treated for high blood pressure? 1  BMI more than 35 kg/m2? 0  Age over 63 years old? 1  Neck circumference greater than 40 cm/16 inches? 1  Gender: 1  Obstructive Sleep Apnea Score 4  Score 4 or greater  Results sent to PCP

## 2013-08-18 NOTE — Pre-Procedure Instructions (Signed)
Pt's preop lipase report abnormal - report routed in epic to Dr. Dois Davenport in basket for review.

## 2013-08-18 NOTE — Patient Instructions (Signed)
FOLLOW YOUR BOWEL PREP INSTRUCTIONS FROM DR. Dois Davenport OFFICE -- DAY BEFORE YOUR SURGERY.   YOUR SURGERY IS SCHEDULED AT [facility]  ON:  Friday  5/8  REPORT TO  SHORT STAY CENTER AT:  6:45 AM      PHONE # FOR SHORT STAY IS [phone]  DO NOT EAT OR DRINK ANYTHING AFTER MIDNIGHT THE NIGHT BEFORE YOUR SURGERY.  YOU MAY BRUSH YOUR TEETH, RINSE OUT YOUR MOUTH--BUT NO WATER, NO FOOD, NO CHEWING GUM, NO MINTS, NO CANDIES, NO CHEWING TOBACCO.  PLEASE TAKE THE FOLLOWING MEDICATIONS THE AM OF YOUR SURGERY WITH A FEW SIPS OF WATER:  LIPITOR   IF YOU ARE DIABETIC:  DO NOT TAKE ANY DIABETIC MEDICATIONS THE AM OF YOUR SURGERY.    DO NOT BRING VALUABLES, MONEY, CREDIT CARDS.  DO NOT WEAR JEWELRY, MAKE-UP, NAIL POLISH AND NO METAL PINS OR CLIPS IN YOUR HAIR. CONTACT LENS, DENTURES / PARTIALS, GLASSES SHOULD NOT BE WORN TO SURGERY AND IN MOST CASES-HEARING AIDS WILL NEED TO BE REMOVED.  BRING YOUR GLASSES CASE, ANY EQUIPMENT NEEDED FOR YOUR CONTACT LENS. FOR PATIENTS ADMITTED TO THE HOSPITAL--CHECK OUT TIME THE DAY OF DISCHARGE IS 11:00 AM.  ALL INPATIENT ROOMS ARE PRIVATE - WITH BATHROOM, TELEPHONE, TELEVISION AND WIFI INTERNET.                                                    PLEASE BE AWARE THAT YOU MAY NEED ADDITIONAL BLOOD DRAWN DAY OF YOUR SURGERY  _______________________________________________________________________   [facility] - Preparing for Surgery Before surgery, you can play an important role.  Because skin is not sterile, your skin needs to be as free of germs as possible.  You can reduce the number of germs on your skin by washing with CHG (chlorahexidine gluconate) soap before surgery.  CHG is an antiseptic cleaner which kills germs and bonds with the skin to continue killing germs even after washing. Please DO NOT use if you have an allergy to CHG or antibacterial soaps.  If your skin becomes reddened/irritated stop using the CHG and inform your nurse when you arrive at  Short Stay. Do not shave (including legs and underarms) for at least 48 hours prior to the first CHG shower.  You may shave your face. Please follow these instructions carefully:  1.  Shower with CHG Soap the night before surgery and the  morning of Surgery.  2.  If you choose to wash your hair, wash your hair first as usual with your  normal  shampoo.  3.  After you shampoo, rinse your hair and body thoroughly to remove the  shampoo.                           4.  Use CHG as you would any other liquid soap.  You can apply chg directly  to the skin and wash                       Gently with a scrungie or clean washcloth.  5.  Apply the CHG Soap to your body ONLY FROM THE NECK DOWN.   Do not use on open  Wound or open sores. Avoid contact with eyes, ears mouth and genitals (private parts).                        Genitals (private parts) with your normal soap.             6.  Wash thoroughly, paying special attention to the area where your surgery  will be performed.  7.  Thoroughly rinse your body with warm water from the neck down.  8.  DO NOT shower/wash with your normal soap after using and rinsing off  the CHG Soap.                9.  Pat yourself dry with a clean towel.            10.  Wear clean pajamas.            11.  Place clean sheets on your bed the night of your first shower and do not  sleep with pets. Day of Surgery : Do not apply any lotions/deodorants the morning of surgery.  Please wear clean clothes to the hospital/surgery center.  FAILURE TO FOLLOW THESE INSTRUCTIONS MAY RESULT IN THE CANCELLATION OF YOUR SURGERY PATIENT SIGNATURE_________________________________  NURSE SIGNATURE__________________________________  ________________________________________________________________________

## 2013-08-19 ENCOUNTER — Encounter (INDEPENDENT_AMBULATORY_CARE_PROVIDER_SITE_OTHER): Payer: Self-pay | Admitting: General Surgery

## 2013-08-26 ENCOUNTER — Telehealth (INDEPENDENT_AMBULATORY_CARE_PROVIDER_SITE_OTHER): Payer: Self-pay | Admitting: General Surgery

## 2013-08-26 NOTE — Telephone Encounter (Signed)
Called patient back to let him know the hospital will have the diet that he will need after surgery. And he stated that he will call them

## 2013-08-26 NOTE — Telephone Encounter (Signed)
Message copied by Maryclare Bean on Tue Aug 26, 2013  9:03 AM ------      Message from: Joya San      Created: Tue Aug 26, 2013  8:58 AM      Contact: [phone]       Pt has sx on 08/29/13,  he has some questions what his diet should be after sx. ------

## 2013-08-29 ENCOUNTER — Inpatient Hospital Stay (HOSPITAL_COMMUNITY): Payer: 59 | Admitting: Anesthesiology

## 2013-08-29 ENCOUNTER — Encounter (HOSPITAL_COMMUNITY): Payer: Self-pay | Admitting: *Deleted

## 2013-08-29 ENCOUNTER — Encounter (HOSPITAL_COMMUNITY): Payer: 59 | Admitting: Anesthesiology

## 2013-08-29 ENCOUNTER — Encounter (HOSPITAL_COMMUNITY)
Admission: RE | Disposition: A | Discharge status: Home / Self Care | Payer: Self-pay | Source: Ambulatory Visit | Attending: General Surgery

## 2013-08-29 ENCOUNTER — Inpatient Hospital Stay (HOSPITAL_COMMUNITY)
Admission: RE | Admit: 2013-08-29 | Discharge: 2013-09-02 | DRG: 330 | Disposition: A | Discharge status: Home / Self Care | Payer: 59 | Source: Ambulatory Visit | Attending: General Surgery | Admitting: General Surgery

## 2013-08-29 DIAGNOSIS — Z9049 Acquired absence of other specified parts of digestive tract: Secondary | ICD-10-CM

## 2013-08-29 DIAGNOSIS — Z6829 Body mass index (BMI) 29.0-29.9, adult: Secondary | ICD-10-CM

## 2013-08-29 DIAGNOSIS — E118 Type 2 diabetes mellitus with unspecified complications: Secondary | ICD-10-CM

## 2013-08-29 DIAGNOSIS — I10 Essential (primary) hypertension: Secondary | ICD-10-CM | POA: Diagnosis present

## 2013-08-29 DIAGNOSIS — IMO0002 Reserved for concepts with insufficient information to code with codable children: Secondary | ICD-10-CM

## 2013-08-29 DIAGNOSIS — E1165 Type 2 diabetes mellitus with hyperglycemia: Secondary | ICD-10-CM | POA: Diagnosis present

## 2013-08-29 DIAGNOSIS — D126 Benign neoplasm of colon, unspecified: Secondary | ICD-10-CM | POA: Diagnosis present

## 2013-08-29 DIAGNOSIS — Q438 Other specified congenital malformations of intestine: Secondary | ICD-10-CM

## 2013-08-29 DIAGNOSIS — Z01812 Encounter for preprocedural laboratory examination: Secondary | ICD-10-CM

## 2013-08-29 DIAGNOSIS — Z87891 Personal history of nicotine dependence: Secondary | ICD-10-CM

## 2013-08-29 DIAGNOSIS — E669 Obesity, unspecified: Secondary | ICD-10-CM | POA: Diagnosis present

## 2013-08-29 DIAGNOSIS — Z79899 Other long term (current) drug therapy: Secondary | ICD-10-CM

## 2013-08-29 DIAGNOSIS — Z8546 Personal history of malignant neoplasm of prostate: Secondary | ICD-10-CM

## 2013-08-29 DIAGNOSIS — K562 Volvulus: Secondary | ICD-10-CM | POA: Diagnosis present

## 2013-08-29 DIAGNOSIS — Z8249 Family history of ischemic heart disease and other diseases of the circulatory system: Secondary | ICD-10-CM

## 2013-08-29 DIAGNOSIS — E785 Hyperlipidemia, unspecified: Secondary | ICD-10-CM | POA: Diagnosis present

## 2013-08-29 DIAGNOSIS — Z87442 Personal history of urinary calculi: Secondary | ICD-10-CM

## 2013-08-29 HISTORY — PX: LAPAROSCOPIC SIGMOID COLECTOMY: SHX5928

## 2013-08-29 LAB — GLUCOSE, CAPILLARY
GLUCOSE-CAPILLARY: 90 mg/dL (ref 70–99)
GLUCOSE-CAPILLARY: 95 mg/dL (ref 70–99)
Glucose-Capillary: 104 mg/dL — ABNORMAL HIGH (ref 70–99)
Glucose-Capillary: 133 mg/dL — ABNORMAL HIGH (ref 70–99)

## 2013-08-29 LAB — TYPE AND SCREEN
ABO/RH(D): A POS
ANTIBODY SCREEN: NEGATIVE

## 2013-08-29 SURGERY — COLECTOMY, SIGMOID, LAPAROSCOPIC
Anesthesia: General

## 2013-08-29 MED ORDER — ACETAMINOPHEN 10 MG/ML IV SOLN
1000.0000 mg | Freq: Once | INTRAVENOUS | Status: AC
  Administered 2013-08-29: 1000 mg via INTRAVENOUS
  Filled 2013-08-29: qty 100

## 2013-08-29 MED ORDER — POTASSIUM CHLORIDE IN NACL 20-0.45 MEQ/L-% IV SOLN
INTRAVENOUS | Status: DC
  Administered 2013-08-30 – 2013-09-01 (×8): via INTRAVENOUS
  Filled 2013-08-29 (×15): qty 1000

## 2013-08-29 MED ORDER — GLYCOPYRROLATE 0.2 MG/ML IJ SOLN
INTRAMUSCULAR | Status: AC
  Filled 2013-08-29: qty 3

## 2013-08-29 MED ORDER — CEFAZOLIN SODIUM-DEXTROSE 2-3 GM-% IV SOLR
INTRAVENOUS | Status: AC
  Filled 2013-08-29: qty 50

## 2013-08-29 MED ORDER — LIDOCAINE HCL (CARDIAC) 20 MG/ML IV SOLN
INTRAVENOUS | Status: AC
  Filled 2013-08-29: qty 5

## 2013-08-29 MED ORDER — HYDROMORPHONE HCL PF 1 MG/ML IJ SOLN
INTRAMUSCULAR | Status: AC
  Filled 2013-08-29: qty 1

## 2013-08-29 MED ORDER — DEXAMETHASONE SODIUM PHOSPHATE 10 MG/ML IJ SOLN
INTRAMUSCULAR | Status: AC
  Filled 2013-08-29: qty 1

## 2013-08-29 MED ORDER — SUCCINYLCHOLINE CHLORIDE 20 MG/ML IJ SOLN
INTRAMUSCULAR | Status: DC | PRN
  Administered 2013-08-29: 140 mg via INTRAVENOUS

## 2013-08-29 MED ORDER — PEG 3350-KCL-NA BICARB-NACL 420 G PO SOLR: 4000.0000 mL | Freq: Once | ORAL | Status: DC

## 2013-08-29 MED ORDER — MIDAZOLAM HCL 5 MG/5ML IJ SOLN
INTRAMUSCULAR | Status: DC | PRN
  Administered 2013-08-29 (×2): 1 mg via INTRAVENOUS

## 2013-08-29 MED ORDER — BUPIVACAINE-EPINEPHRINE 0.25% -1:200000 IJ SOLN
INTRAMUSCULAR | Status: AC
  Filled 2013-08-29: qty 1

## 2013-08-29 MED ORDER — LACTATED RINGERS IV SOLN
INTRAVENOUS | Status: DC
  Administered 2013-08-29: 1000 mL via INTRAVENOUS
  Administered 2013-08-29: 10:00:00 via INTRAVENOUS

## 2013-08-29 MED ORDER — HEPARIN SODIUM (PORCINE) 5000 UNIT/ML IJ SOLN
5000.0000 [IU] | Freq: Once | INTRAMUSCULAR | Status: AC
  Administered 2013-08-29: 5000 [IU] via SUBCUTANEOUS
  Filled 2013-08-29: qty 1

## 2013-08-29 MED ORDER — FENTANYL CITRATE 0.05 MG/ML IJ SOLN
INTRAMUSCULAR | Status: AC
  Filled 2013-08-29: qty 5

## 2013-08-29 MED ORDER — CISATRACURIUM BESYLATE 20 MG/10ML IV SOLN
INTRAVENOUS | Status: AC
  Filled 2013-08-29: qty 10

## 2013-08-29 MED ORDER — ENOXAPARIN SODIUM 40 MG/0.4ML ~~LOC~~ SOLN
40.0000 mg | SUBCUTANEOUS | Status: DC
  Administered 2013-08-30 – 2013-09-02 (×4): 40 mg via SUBCUTANEOUS
  Filled 2013-08-29 (×5): qty 0.4

## 2013-08-29 MED ORDER — ONDANSETRON HCL 4 MG/2ML IJ SOLN: 4.0000 mg | Freq: Four times a day (QID) | INTRAMUSCULAR | Status: DC | PRN

## 2013-08-29 MED ORDER — ONDANSETRON HCL 4 MG/2ML IJ SOLN
INTRAMUSCULAR | Status: DC | PRN
Start: 2013-08-29 — End: 2013-08-29
  Administered 2013-08-29: 4 mg via INTRAVENOUS

## 2013-08-29 MED ORDER — FENTANYL CITRATE 0.05 MG/ML IJ SOLN
INTRAMUSCULAR | Status: DC | PRN
  Administered 2013-08-29: 50 ug via INTRAVENOUS
  Administered 2013-08-29: 100 ug via INTRAVENOUS
  Administered 2013-08-29: 50 ug via INTRAVENOUS
  Administered 2013-08-29 (×2): 100 ug via INTRAVENOUS

## 2013-08-29 MED ORDER — LOSARTAN POTASSIUM 50 MG PO TABS: 100.0000 mg | ORAL_TABLET | Freq: Every morning | ORAL | Status: DC

## 2013-08-29 MED ORDER — ALVIMOPAN 12 MG PO CAPS
12.0000 mg | ORAL_CAPSULE | Freq: Two times a day (BID) | ORAL | Status: DC
  Administered 2013-08-30 – 2013-08-31 (×3): 12 mg via ORAL
  Filled 2013-08-29 (×4): qty 1

## 2013-08-29 MED ORDER — HYDROMORPHONE HCL PF 1 MG/ML IJ SOLN
0.2500 mg | INTRAMUSCULAR | Status: DC | PRN
  Administered 2013-08-29 (×4): 0.5 mg via INTRAVENOUS

## 2013-08-29 MED ORDER — METRONIDAZOLE IN NACL 5-0.79 MG/ML-% IV SOLN
500.0000 mg | Freq: Three times a day (TID) | INTRAVENOUS | Status: AC
  Administered 2013-08-29: 500 mg via INTRAVENOUS
  Filled 2013-08-29: qty 100

## 2013-08-29 MED ORDER — DIPHENHYDRAMINE HCL 50 MG/ML IJ SOLN: 12.5000 mg | Freq: Four times a day (QID) | INTRAMUSCULAR | Status: DC | PRN

## 2013-08-29 MED ORDER — METRONIDAZOLE IN NACL 5-0.79 MG/ML-% IV SOLN
500.0000 mg | INTRAVENOUS | Status: AC
  Administered 2013-08-29: 500 mg via INTRAVENOUS
  Filled 2013-08-29: qty 100

## 2013-08-29 MED ORDER — CISATRACURIUM BESYLATE (PF) 10 MG/5ML IV SOLN
INTRAVENOUS | Status: DC | PRN
  Administered 2013-08-29: 2 mg via INTRAVENOUS
  Administered 2013-08-29: 8 mg via INTRAVENOUS
  Administered 2013-08-29: 4 mg via INTRAVENOUS

## 2013-08-29 MED ORDER — LIDOCAINE HCL (CARDIAC) 20 MG/ML IV SOLN
INTRAVENOUS | Status: DC | PRN
  Administered 2013-08-29: 60 mg via INTRAVENOUS

## 2013-08-29 MED ORDER — MIDAZOLAM HCL 2 MG/2ML IJ SOLN
INTRAMUSCULAR | Status: AC
  Filled 2013-08-29: qty 2

## 2013-08-29 MED ORDER — ESMOLOL HCL 10 MG/ML IV SOLN
INTRAVENOUS | Status: DC | PRN
  Administered 2013-08-29 (×3): 10 mg via INTRAVENOUS
  Administered 2013-08-29: 20 mg via INTRAVENOUS

## 2013-08-29 MED ORDER — METRONIDAZOLE IN NACL 5-0.79 MG/ML-% IV SOLN
INTRAVENOUS | Status: AC
  Filled 2013-08-29: qty 100

## 2013-08-29 MED ORDER — CEFAZOLIN SODIUM-DEXTROSE 2-3 GM-% IV SOLR
2.0000 g | Freq: Three times a day (TID) | INTRAVENOUS | Status: AC
  Administered 2013-08-29: 2 g via INTRAVENOUS
  Filled 2013-08-29: qty 50

## 2013-08-29 MED ORDER — MORPHINE SULFATE (PF) 1 MG/ML IV SOLN
INTRAVENOUS | Status: AC
  Filled 2013-08-29: qty 25

## 2013-08-29 MED ORDER — NEOMYCIN SULFATE 500 MG PO TABS: 1000.0000 mg | ORAL_TABLET | ORAL | Status: DC

## 2013-08-29 MED ORDER — LOSARTAN POTASSIUM 50 MG PO TABS
100.0000 mg | ORAL_TABLET | Freq: Every morning | ORAL | Status: DC
  Administered 2013-08-29 – 2013-09-02 (×5): 100 mg via ORAL
  Filled 2013-08-29 (×5): qty 2

## 2013-08-29 MED ORDER — NEOSTIGMINE METHYLSULFATE 10 MG/10ML IV SOLN
INTRAVENOUS | Status: DC | PRN
  Administered 2013-08-29: 3 mg via INTRAVENOUS

## 2013-08-29 MED ORDER — GLYCOPYRROLATE 0.2 MG/ML IJ SOLN
INTRAMUSCULAR | Status: DC | PRN
  Administered 2013-08-29: .4 mg via INTRAVENOUS

## 2013-08-29 MED ORDER — ONDANSETRON HCL 4 MG/2ML IJ SOLN
INTRAMUSCULAR | Status: AC
  Filled 2013-08-29: qty 2

## 2013-08-29 MED ORDER — PROPOFOL 10 MG/ML IV BOLUS
INTRAVENOUS | Status: DC | PRN
  Administered 2013-08-29: 150 mg via INTRAVENOUS

## 2013-08-29 MED ORDER — BUPIVACAINE-EPINEPHRINE 0.25% -1:200000 IJ SOLN
INTRAMUSCULAR | Status: DC | PRN
  Administered 2013-08-29: 15 mL

## 2013-08-29 MED ORDER — PANTOPRAZOLE SODIUM 40 MG IV SOLR
40.0000 mg | INTRAVENOUS | Status: DC
  Administered 2013-08-29 – 2013-08-30 (×2): 40 mg via INTRAVENOUS
  Filled 2013-08-29 (×6): qty 40

## 2013-08-29 MED ORDER — LACTATED RINGERS IV SOLN
INTRAVENOUS | Status: DC
  Administered 2013-08-29: 13:00:00 via INTRAVENOUS

## 2013-08-29 MED ORDER — ALVIMOPAN 12 MG PO CAPS
12.0000 mg | ORAL_CAPSULE | Freq: Once | ORAL | Status: AC
  Administered 2013-08-29: 12 mg via ORAL
  Filled 2013-08-29: qty 1

## 2013-08-29 MED ORDER — ERYTHROMYCIN BASE 250 MG PO TABS: 1000.0000 mg | ORAL_TABLET | ORAL | Status: DC

## 2013-08-29 MED ORDER — NEOSTIGMINE METHYLSULFATE 10 MG/10ML IV SOLN
INTRAVENOUS | Status: AC
  Filled 2013-08-29: qty 1

## 2013-08-29 MED ORDER — INSULIN ASPART 100 UNIT/ML ~~LOC~~ SOLN
0.0000 [IU] | Freq: Three times a day (TID) | SUBCUTANEOUS | Status: DC
  Administered 2013-08-31 – 2013-09-01 (×3): 3 [IU] via SUBCUTANEOUS

## 2013-08-29 MED ORDER — ACETAMINOPHEN 10 MG/ML IV SOLN
1000.0000 mg | Freq: Four times a day (QID) | INTRAVENOUS | Status: AC
  Administered 2013-08-29 – 2013-08-30 (×4): 1000 mg via INTRAVENOUS
  Filled 2013-08-29 (×4): qty 100

## 2013-08-29 MED ORDER — MORPHINE SULFATE (PF) 1 MG/ML IV SOLN
INTRAVENOUS | Status: DC
  Administered 2013-08-29: 6 mg via INTRAVENOUS
  Administered 2013-08-29: 12:00:00 via INTRAVENOUS
  Administered 2013-08-29 (×3): 3 mg via INTRAVENOUS
  Administered 2013-08-29: 1.5 mg via INTRAVENOUS
  Administered 2013-08-30: 4.5 mg via INTRAVENOUS
  Administered 2013-08-30 (×2): 3 mg via INTRAVENOUS

## 2013-08-29 MED ORDER — DIPHENHYDRAMINE HCL 12.5 MG/5ML PO ELIX
12.5000 mg | ORAL_SOLUTION | Freq: Four times a day (QID) | ORAL | Status: DC | PRN
Start: 2013-08-29 — End: 2013-08-31

## 2013-08-29 MED ORDER — ALBUTEROL SULFATE HFA 108 (90 BASE) MCG/ACT IN AERS
INHALATION_SPRAY | RESPIRATORY_TRACT | Status: DC | PRN
  Administered 2013-08-29: 5 via RESPIRATORY_TRACT

## 2013-08-29 MED ORDER — PROPOFOL 10 MG/ML IV BOLUS
INTRAVENOUS | Status: AC
  Filled 2013-08-29: qty 20

## 2013-08-29 MED ORDER — SODIUM CHLORIDE 0.9 % IJ SOLN
9.0000 mL | INTRAMUSCULAR | Status: DC | PRN
Start: 2013-08-29 — End: 2013-08-31

## 2013-08-29 MED ORDER — NALOXONE HCL 0.4 MG/ML IJ SOLN: 0.4000 mg | INTRAMUSCULAR | Status: DC | PRN

## 2013-08-29 MED ORDER — CEFAZOLIN SODIUM-DEXTROSE 2-3 GM-% IV SOLR
2.0000 g | INTRAVENOUS | Status: AC
  Administered 2013-08-29: 2 g via INTRAVENOUS

## 2013-08-29 SURGICAL SUPPLY — 69 items
APPLIER CLIP 5 13 M/L LIGAMAX5 (MISCELLANEOUS)
APPLIER CLIP ROT 10 11.4 M/L (STAPLE)
BANDAGE ADHESIVE 1X3 (GAUZE/BANDAGES/DRESSINGS) ×3 IMPLANT
BLADE EXTENDED COATED 6.5IN (ELECTRODE) IMPLANT
BLADE HEX COATED 2.75 (ELECTRODE) ×6 IMPLANT
BLADE SURG SZ10 CARB STEEL (BLADE) ×3 IMPLANT
CANISTER SUCTION 2500CC (MISCELLANEOUS) ×3 IMPLANT
CELLS DAT CNTRL 66122 CELL SVR (MISCELLANEOUS) IMPLANT
CHLORAPREP W/TINT 26ML (MISCELLANEOUS) ×6 IMPLANT
CLIP APPLIE 5 13 M/L LIGAMAX5 (MISCELLANEOUS) IMPLANT
CLIP APPLIE ROT 10 11.4 M/L (STAPLE) IMPLANT
COVER MAYO STAND STRL (DRAPES) ×6 IMPLANT
DRAIN CHANNEL 19F RND (DRAIN) IMPLANT
DRAPE LAPAROSCOPIC ABDOMINAL (DRAPES) ×3 IMPLANT
DRAPE LG THREE QUARTER DISP (DRAPES) ×6 IMPLANT
DRAPE UTILITY XL STRL (DRAPES) ×6 IMPLANT
DRAPE WARM FLUID 44X44 (DRAPE) ×3 IMPLANT
DRSG OPSITE POSTOP 4X8 (GAUZE/BANDAGES/DRESSINGS) ×3 IMPLANT
DRSG TELFA 3X8 NADH (GAUZE/BANDAGES/DRESSINGS) IMPLANT
ELECT REM PT RETURN 9FT ADLT (ELECTROSURGICAL) ×3
ELECTRODE REM PT RTRN 9FT ADLT (ELECTROSURGICAL) ×1 IMPLANT
EVACUATOR SILICONE 100CC (DRAIN) IMPLANT
GLOVE BIO SURGEON STRL SZ7.5 (GLOVE) ×12 IMPLANT
GLOVE INDICATOR 8.0 STRL GRN (GLOVE) ×9 IMPLANT
GOWN STRL REUS W/TWL XL LVL3 (GOWN DISPOSABLE) ×24 IMPLANT
KIT BASIN OR (CUSTOM PROCEDURE TRAY) ×3 IMPLANT
LEGGING LITHOTOMY PAIR STRL (DRAPES) ×3 IMPLANT
LIGASURE IMPACT 36 18CM CVD LR (INSTRUMENTS) ×3 IMPLANT
PENCIL BUTTON HOLSTER BLD 10FT (ELECTRODE) ×6 IMPLANT
RELOAD PROXIMATE 75MM BLUE (ENDOMECHANICALS) ×3 IMPLANT
RTRCTR WOUND ALEXIS 18CM MED (MISCELLANEOUS)
SEPRAFILM MEMBRANE 5X6 (MISCELLANEOUS) IMPLANT
SEPRAFILM PROCEDURAL PACK 3X5 (MISCELLANEOUS) IMPLANT
SET IRRIG TUBING LAPAROSCOPIC (IRRIGATION / IRRIGATOR) ×3 IMPLANT
SHEARS HARMONIC ACE PLUS 36CM (ENDOMECHANICALS) IMPLANT
SLEEVE XCEL OPT CAN 5 100 (ENDOMECHANICALS) IMPLANT
SOLUTION ANTI FOG 6CC (MISCELLANEOUS) ×3 IMPLANT
SPONGE GAUZE 4X4 12PLY (GAUZE/BANDAGES/DRESSINGS) ×3 IMPLANT
SPONGE LAP 18X18 X RAY DECT (DISPOSABLE) ×3 IMPLANT
STAPLER CIRC CVD 29MM 37CM (STAPLE) ×3 IMPLANT
STAPLER PROXIMATE 75MM BLUE (STAPLE) ×3 IMPLANT
STAPLER VISISTAT 35W (STAPLE) ×3 IMPLANT
SUCTION POOLE TIP (SUCTIONS) ×3 IMPLANT
SUT ETHILON 2 0 PS N (SUTURE) IMPLANT
SUT MNCRL AB 4-0 PS2 18 (SUTURE) ×6 IMPLANT
SUT PDS AB 0 CTX 60 (SUTURE) IMPLANT
SUT PDS AB 1 TP1 96 (SUTURE) ×6 IMPLANT
SUT SILK 2 0 (SUTURE) ×2
SUT SILK 2 0 SH CR/8 (SUTURE) ×3 IMPLANT
SUT SILK 2 0SH CR/8 30 (SUTURE) IMPLANT
SUT SILK 2-0 18XBRD TIE 12 (SUTURE) ×1 IMPLANT
SUT SILK 2-0 30XBRD TIE 12 (SUTURE) IMPLANT
SUT SILK 3 0 (SUTURE) ×2
SUT SILK 3 0 SH CR/8 (SUTURE) ×3 IMPLANT
SUT SILK 3-0 18XBRD TIE 12 (SUTURE) ×1 IMPLANT
SUT VIC AB 1 CTX 18 (SUTURE) IMPLANT
SUT VIC AB 3-0 SH 18 (SUTURE) IMPLANT
SYR BULB IRRIGATION 50ML (SYRINGE) ×3 IMPLANT
SYS LAPSCP GELPORT 120MM (MISCELLANEOUS) ×3
SYSTEM LAPSCP GELPORT 120MM (MISCELLANEOUS) ×1 IMPLANT
TOWEL OR 17X26 10 PK STRL BLUE (TOWEL DISPOSABLE) ×6 IMPLANT
TOWEL OR NON WOVEN STRL DISP B (DISPOSABLE) ×9 IMPLANT
TRAY FOLEY CATH 14FRSI W/METER (CATHETERS) ×3 IMPLANT
TRAY LAP CHOLE (CUSTOM PROCEDURE TRAY) ×3 IMPLANT
TROCAR BLADELESS OPT 5 100 (ENDOMECHANICALS) ×12 IMPLANT
TROCAR XCEL 12X100 BLDLESS (ENDOMECHANICALS) IMPLANT
TROCAR XCEL NON-BLD 11X100MML (ENDOMECHANICALS) IMPLANT
TUBING FILTER THERMOFLATOR (ELECTROSURGICAL) ×3 IMPLANT
YANKAUER SUCT BULB TIP 10FT TU (MISCELLANEOUS) ×6 IMPLANT

## 2013-08-29 NOTE — Transfer of Care (Signed)
Immediate Anesthesia Transfer of Care Note  Patient: Donald Hanson  Procedure(s) Performed: Procedure(s): LAPAROSCOPIC assisted COLECTOMY (N/A)  Patient Location: PACU  Anesthesia Type:General  Level of Consciousness: awake, oriented, pateint uncooperative, lethargic and responds to stimulation  Airway & Oxygen Therapy: Patient Spontanous Breathing and Patient connected to face mask oxygen  Post-op Assessment: Report given to PACU RN, Post -op Vital signs reviewed and stable and Patient moving all extremities  Post vital signs: Reviewed and stable  Complications: No apparent anesthesia complications

## 2013-08-29 NOTE — H&P (View-Only) (Signed)
Patient ID: Donald Hanson, male   DOB: 1950/05/23, 63 y.o.   MRN: [id number]  Chief Complaint  Patient presents with  . Colon Polyps    HPI Donald Hanson is a 63 y.o. male.   HPI 63 year old Caucasian male comes in for followup after his recent hospitalization from March 6 through the ninth for acute onset of right-sided abdominal pain. He was found to have a very redundant sigmoid colon and a sigmoid volvulus. He underwent colonoscopic partial decompression. He ultimately resolved without having to have active intervention. He is here today to discuss elective colectomy in order to prevent recurrent sigmoid volvulus. Since discharge from the hospital he states he has been doing well. He denies any fevers or chills. He denies any nausea or vomiting. He denies any abdominal pain. He reports normal bowel movements. He did undergo a colonoscopy last week which was normal except for very redundant colon. Past Medical History  Diagnosis Date  . Diabetes   . Hypertension   . Hyperlipidemia   . Seasonal allergies   . Cancer 2010    prostate cancer  . History of kidney stones   . Elevated amylase and lipase 07/18/2013    Past Surgical History  Procedure Laterality Date  . Prostatectomy  2010  . Nasal fracture  1972  . Kidney stone surgery    . Colonoscopy N/A 06/27/2013    Procedure: COLONOSCOPY;  Surgeon: Irene Shipper, MD;  Location: [facility];  Service: Endoscopy;  Laterality: N/A;    Family History  Problem Relation Age of Onset  . Colon cancer Neg Hx   . Cancer - Ovarian Mother   . Heart disease Mother   . Other Father     Social History History  Substance Use Topics  . Smoking status: Former Smoker    Quit date: 04/24/1968  . Smokeless tobacco: Never Used  . Alcohol Use: 1.2 oz/week    2 Cans of beer per week     Comment: 2  BEERS A WEEK    No Known Allergies  Current Outpatient Prescriptions  Medication Sig Dispense Refill  . atorvastatin (LIPITOR) 80 MG tablet Take  80 mg by mouth daily.      Marland Kitchen glipiZIDE (GLUCOTROL XL) 5 MG 24 hr tablet Take 5 mg by mouth 2 (two) times daily.      Marland Kitchen losartan (COZAAR) 100 MG tablet       . metFORMIN (GLUMETZA) 500 MG (MOD) 24 hr tablet Take 500 mg by mouth 2 (two) times daily with a meal. Takes 2 tablets twice daily      . Omega-3 Fatty Acids (FISH OIL) 1000 MG CPDR Take by mouth 2 (two) times daily.      . sitaGLIPtin (JANUVIA) 100 MG tablet Take 100 mg by mouth daily.      . Vitamin D, Cholecalciferol, 1000 UNITS TABS Take by mouth 2 (two) times daily.      Marland Kitchen HYDROcodone-acetaminophen (NORCO) 10-325 MG per tablet Take 1 tablet by mouth every 6 (six) hours as needed for moderate pain or severe pain.       No current facility-administered medications for this visit.    Review of Systems Review of Systems  Constitutional: Negative for fever, chills, appetite change and unexpected weight change.  HENT: Negative for congestion and trouble swallowing.   Eyes: Negative for visual disturbance.  Respiratory: Negative for chest tightness and shortness of breath.   Cardiovascular: Negative for chest pain and leg swelling.  No PND, no orthopnea, no DOE  Gastrointestinal:       See HPI  Genitourinary: Negative for dysuria, frequency, hematuria and difficulty urinating.  Musculoskeletal: Negative.   Skin: Negative for rash.  Neurological: Negative for seizures and speech difficulty.  Hematological: Does not bruise/bleed easily.  Psychiatric/Behavioral: Negative for behavioral problems and confusion.    Blood pressure 150/82, pulse 82, temperature 97.8 F (36.6 C), temperature source Oral, resp. rate 16, height 5\' 10"  (1.778 m), weight 212 lb 9.6 oz (96.435 kg).  Physical Exam Physical Exam  Constitutional: He is oriented to person, place, and time. He appears well-developed and well-nourished. No distress.  HENT:  Head: Normocephalic and atraumatic.  Right Ear: External ear normal.  Left Ear: External ear normal.    Eyes: Conjunctivae are normal. No scleral icterus.  Neck: Normal range of motion. Neck supple. No tracheal deviation present. No thyromegaly present.  Cardiovascular: Normal rate, normal heart sounds and intact distal pulses.   Pulmonary/Chest: Effort normal and breath sounds normal. No respiratory distress. He has no wheezes.  Abdominal: Soft. He exhibits no distension. There is no tenderness. There is no rebound and no guarding.    Scattered trocar sites; upper midline diastasis. Small fascial defect at umbilicus.   Musculoskeletal: Normal range of motion. He exhibits no edema and no tenderness.  Lymphadenopathy:    He has no cervical adenopathy.  Neurological: He is alert and oriented to person, place, and time. He exhibits normal muscle tone.  Skin: Skin is warm and dry. No rash noted. He is not diaphoretic. No erythema. No pallor.  Psychiatric: He has a normal mood and affect. His behavior is normal. Judgment and thought content normal.    Data Reviewed Hospital records CT scan Dr Celesta Aver colonoscopy April 2015 - redundant colon Labs: amylase 73; isoamylase 70, salivary function 3  Assessment    Redundant sigmoid colon H/o Sigmoid volvulus     Plan    We discussed the hospital findings as well as his colonoscopy report and the CT scan. He does have a very redundant sigmoid colon. We discussed that he is at high risk for recurrent volvulus at some point in the future. We discussed that the ideal plan would be to plan elective resection in order to avoid a colostomy. He has elected to proceed with surgery  I discussed the procedure in detail.  The patient was given Neurosurgeon.  We discussed the risks and benefits of surgery including, but not limited to bleeding, infection (such as wound infection, abdominal abscess), injury to surrounding structures, blood clot formation, urinary retention, incisional hernia, anastomotic stricture, anastomotic leak, anesthesia risks,  pulmonary & cardiac complications such as pneumonia &/or heart attack, need for additional procedures, ileus, & prolonged hospitalization.  We discussed the typical postoperative recovery course, including limitations & restrictions postoperatively. I explained that the likelihood of improvement in their symptoms is good.   He will be with the scheduler today to plan for laparoscopic-assisted colectomy. He was given a prescription for his bowel prep. I will send and at that message to Dr. Carlean Purl asking him about his elevated lipase and amylase levels. His pancreas showed no abnormality on his CT scan. Therefore I think it is safe to proceed with colectomy  Leighton Ruff. Redmond Pulling, MD, FACS General, Bariatric, & Minimally Invasive Surgery [facility] [place]        [name] 08/01/2013, 2:56 PM

## 2013-08-29 NOTE — Interval H&P Note (Signed)
History and Physical Interval Note:  08/29/2013 8:21 AM  Donald Hanson  has presented today for surgery, with the diagnosis of sigmoid volvulus   The various methods of treatment have been discussed with the patient and family. After consideration of risks, benefits and other options for treatment, the patient has consented to  Procedure(s): LAPAROSCOPIC assisted COLECTOMY (N/A) as a surgical intervention .  The patient's history has been reviewed, patient examined, no change in status, stable for surgery.  I have reviewed the patient's chart and labs.  Questions were answered to the patient's satisfaction.    Leighton Ruff. Redmond Pulling, MD, [employer], Bariatric, & Minimally Invasive Surgery [facility], [place]   [name]

## 2013-08-29 NOTE — Anesthesia Procedure Notes (Signed)
Performed by: Ofilia Neas

## 2013-08-29 NOTE — Anesthesia Postprocedure Evaluation (Signed)
  Anesthesia Post-op Note  Patient: Donald Hanson  Procedure(s) Performed: Procedure(s) (LRB): LAPAROSCOPIC assisted COLECTOMY (N/A)  Patient Location: PACU  Anesthesia Type: General  Level of Consciousness: awake and alert   Airway and Oxygen Therapy: Patient Spontanous Breathing  Post-op Pain: mild  Post-op Assessment: Post-op Vital signs reviewed, Patient's Cardiovascular Status Stable, Respiratory Function Stable, Patent Airway and No signs of Nausea or vomiting  Last Vitals:  Filed Vitals:   08/29/13 1200  BP: 149/79  Pulse: 66  Temp: 36.8 C  Resp: 16    Post-op Vital Signs: stable   Complications: No apparent anesthesia complications

## 2013-08-29 NOTE — Brief Op Note (Signed)
08/29/2013  11:20 AM  PATIENT:  Donald Hanson  63 y.o. male  PRE-OPERATIVE DIAGNOSIS:  H/o sigmoid volvulus   POST-OPERATIVE DIAGNOSIS:  H/o sigmoid volvulus   PROCEDURE:  Procedure(s): LAPAROSCOPIC assisted  Sigmoid COLECTOMY (N/A)  SURGEON:  Surgeon(s) and Role:    * [name], MD - Primary  PHYSICIAN ASSISTANT: none  ASSISTANTS: Leighton Ruff, MD   ANESTHESIA:   local and general  EBL:  Total I/O In: 1000 [I.V.:1000] Out: 275 [Urine:275]  BLOOD ADMINISTERED:none  DRAINS: Urinary Catheter (Foley)   LOCAL MEDICATIONS USED:  MARCAINE     SPECIMEN:  Source of Specimen:  sigmoid colon, stitch proximal  DISPOSITION OF SPECIMEN:  PATHOLOGY  COUNTS:  YES  TOURNIQUET:  * No tourniquets in log *  DICTATION: .Other Dictation: Dictation Number [id number]  PLAN OF CARE: Admit to inpatient   PATIENT DISPOSITION:  PACU - hemodynamically stable.   Delay start of Pharmacological VTE agent (>24hrs) due to surgical blood loss or risk of bleeding: no  Leighton Ruff. Redmond Pulling, MD, FACS General, Bariatric, & Minimally Invasive Surgery [facility], [place]

## 2013-08-29 NOTE — Anesthesia Preprocedure Evaluation (Addendum)
Anesthesia Evaluation  Patient identified by MRN, date of birth, ID band Patient awake    Reviewed: Allergy & Precautions, H&P , NPO status , Patient's Chart, lab work & pertinent test results  Airway Mallampati: II TM Distance: >3 FB Neck ROM: Full    Dental  (+) Missing, Dental Advisory Given Missing left upper lateral incissor:   Pulmonary neg pulmonary ROS, former smoker,  breath sounds clear to auscultation  Pulmonary exam normal       Cardiovascular hypertension, Pt. on medications Rhythm:Regular Rate:Normal     Neuro/Psych negative neurological ROS  negative psych ROS   GI/Hepatic negative GI ROS, Neg liver ROS, Elevated amylase and lipase 3/15   Endo/Other  diabetes, Well Controlled, Type 2, Oral Hypoglycemic Agents  Renal/GU negative Renal ROS  negative genitourinary   Musculoskeletal negative musculoskeletal ROS (+)   Abdominal   Peds negative pediatric ROS (+)  Hematology negative hematology ROS (+)   Anesthesia Other Findings   Reproductive/Obstetrics negative OB ROS                          Anesthesia Physical Anesthesia Plan  ASA: III  Anesthesia Plan: General   Post-op Pain Management:    Induction: Intravenous  Airway Management Planned: Oral ETT  Additional Equipment:   Intra-op Plan:   Post-operative Plan: Extubation in OR  Informed Consent:   Plan Discussed with: Surgeon  Anesthesia Plan Comments:         Anesthesia Quick Evaluation

## 2013-08-30 LAB — GLUCOSE, CAPILLARY
Glucose-Capillary: 97 mg/dL (ref 70–99)
Glucose-Capillary: 84 mg/dL (ref 70–99)
Glucose-Capillary: 91 mg/dL (ref 70–99)

## 2013-08-30 LAB — CBC
HCT: 41.7 % (ref 39.0–52.0)
Hemoglobin: 13.7 g/dL (ref 13.0–17.0)
MCH: 28.6 pg (ref 26.0–34.0)
MCHC: 32.9 g/dL (ref 30.0–36.0)
MCV: 87.1 fL (ref 78.0–100.0)
PLATELETS: 221 10*3/uL (ref 150–400)
RBC: 4.79 MIL/uL (ref 4.22–5.81)
RDW: 13 % (ref 11.5–15.5)
WBC: 9.1 10*3/uL (ref 4.0–10.5)

## 2013-08-30 LAB — BASIC METABOLIC PANEL
BUN: 10 mg/dL (ref 6–23)
CALCIUM: 8.7 mg/dL (ref 8.4–10.5)
CO2: 25 mEq/L (ref 19–32)
CREATININE: 0.94 mg/dL (ref 0.50–1.35)
Chloride: 101 mEq/L (ref 96–112)
GFR calc non Af Amer: 88 mL/min — ABNORMAL LOW (ref >90)
Glucose, Bld: 103 mg/dL — ABNORMAL HIGH (ref 70–99)
POTASSIUM: 4.3 meq/L (ref 3.7–5.3)
Sodium: 138 mEq/L (ref 137–147)

## 2013-08-30 MED ORDER — BIOTENE DRY MOUTH MT LIQD
15.0000 mL | Freq: Two times a day (BID) | OROMUCOSAL | Status: DC
  Administered 2013-08-30: 15 mL via OROMUCOSAL

## 2013-08-30 MED ORDER — CHLORHEXIDINE GLUCONATE 0.12 % MT SOLN
15.0000 mL | Freq: Two times a day (BID) | OROMUCOSAL | Status: DC
  Administered 2013-08-30 – 2013-09-01 (×2): 15 mL via OROMUCOSAL
  Filled 2013-08-30 (×9): qty 15

## 2013-08-30 NOTE — Op Note (Signed)
NAMEWEI, POPLASKI NO.:  [id number]  MEDICAL RECORD NO.:  [id number]  LOCATION:  58                         FACILITY:  [facility]  PHYSICIAN:  Leighton Ruff. Redmond Pulling, MD, FACSDATE OF BIRTH:  Mar 12, 1951  DATE OF PROCEDURE:  08/29/2013 DATE OF DISCHARGE:                              OPERATIVE REPORT   PREOPERATIVE DIAGNOSIS:  History of sigmoid volvulus.  POSTOPERATIVE DIAGNOSIS:  History of sigmoid volvulus.  PROCEDURE:  Laparoscopic-assisted sigmoid colectomy.  SURGEON:  Leighton Ruff. Redmond Pulling, MD, FACS  ASSISTANT SURGEON:  Leighton Ruff, MD  ANESTHESIA:  General plus 0.25% Marcaine with epi.  ESTIMATED BLOOD LOSS:  Minimal.  FINDINGS:  The patient had some omental adhesions to his lower midline from his prior prostatectomy.  He had a very redundant sigmoid colon starting at the level of the pelvic inlet, extending into the right upper quadrant and then coming back down.  SPECIMEN:  Sigmoid colon, stitch marks proximal margin.  INDICATIONS FOR PROCEDURE:  The patient is a pleasant 63 year old gentleman who was admitted to the hospital about a month and a half ago with abdominal pain.  He was found to have a sigmoid volvulus.  He underwent colonoscopic evaluation with the partial decompression.  He was able to resolve without surgical intervention during that hospitalization.  Because he had been admitted for a sigmoid volvulus, he was referred to my office to discuss elective colon resection in order to prevent recurrence.  We discussed the risks and benefits of surgery including, but not limited to, bleeding, infection, injury to surrounding structures, anastomotic leak, anastomotic stricture, incisional hernia, wound infection, blood clot formation, urinary retention, injury to surrounding structures, cardiac and pulmonary issues as well as typical recovery course.  He elected to proceed with surgery.  DESCRIPTION OF PROCEDURE:  After obtaining informed consent  and the patient receiving 5000 units of subcutaneous heparin and a dose of oral indirect, he was taken to the operating room #11 at [facility], placed supine on the operating table and general endotracheal anesthesia was established.  He was then placed in lithotomy position with appropriate padding.  A Foley catheter was placed.  His arms were tucked at his side with the appropriate padding.  His abdomen was prepped and draped in the usual standard surgical fashion with ChloraPrep.  His perineum was prepped with Betadine.  He did have some gross hemorrhoidal disease on visual inspection of his rectum.  A surgical time-out was performed.  He received IV antibiotics prior to skin incision.  I elected to gain entry to his abdomen in the left upper quadrant using the Optiview technique.  A small 0.5 cm incision was made 2 fingerbreadths below the left subcostal margin.  Then using a 5-mm 0- degree laparoscope through a 5-mm trocar, advanced through all layers of the abdominal wall, and safely entered the abdominal cavity. Pneumoperitoneum was smoothly established up to the patient's pressure of 15 mmHg.  The laparoscope was advanced.  There was no evidence of injury to surrounding structures.  Pneumoperitoneum was well tolerated. I placed two 5-mm trocars on the right side of the abdomen in the midclavicular line.  I ended up noticing that he  had omentum in his midline at the level of the umbilicus below his pelvic inlet stuck to the abdominal wall.  Left lower quadrant trocar was placed, 5 mm.  So, the assistant could help retract.  I then started taking down the omentum from the abdominal wall with Endo Shears with and without electrocautery.  We ended up placing him somewhat in Trendelenburg to help with mobilization of the omentum.  After irrigating down into the pelvis and it was still attached, I placed a 5-mm trocar in the supraumbilical position under direct  visualization, so that we could visualize better.  We were finally able to free the omentum from the pelvis and lifted up in the upper abdomen.  I identified the rectosigmoid junction and followed it proximally.  This revealed a very dilated sigmoid colon starting at the pelvic inlet going back proximally.  At the sigmoid colon that extended up into the right abdomen and then curled back downward into the left lower quadrant. There appeared to be an adequate bulky redundant mesentery in sigmoid colon.  I did not feel I need to mobilize anymore colon in order to do the resection.  Therefore, mini lower midline incision was made of about 4 inches in length starting at the pubic bone extending it superiorly. The subcutaneous tissue was divided with electrocautery.  The fascia was divided with electrocautery and the abdominal cavity was entered.  A wound protector was placed.  We were able to easily eviscerate the redundant sigmoid colon.  I decided to transect it at this level of the pelvic inlet where it became the rectum.  A rent was made in the mesentery using my finger as well as Bovie electrocautery.  The upper rectum was then divided with GIA-75 stapler with a blue load.  We then started taking down the mesentery back proximal in a serial fashion using the LigaSure device, staying close to the colon wall.  Once we had mobilized it back to normal-caliber sigmoid colon to the left lower quadrant, I decided to divide it proximately in a similar fashion with the GIA-75 stapler with a blue load.  This freed the sigmoid colon.  A stitch was used to mark the proximal margin.  It was passed off the field.  I reinspected the mesentery that had been mobilized and saw no evidence of bleeding.  We then cleaned up the proximal colon and took down some epiploic appendages with electrocautery as well as with LigaSure device.  It appeared that the proximal colon would easily reach into the pelvis.  I  cleaned up the first 1.5-2 cm from the proximal colon staple line and freeing it up surrounding fat and omentum.  I then transected the proximal staple line with electrocautery.  Thus, opening up the proximal colon.  It appeared to have normal caliber.  A purse- string clamp was then placed across the opened colon and a 2.0 Lanny Hurst needle was then threaded to create a purse-string suture.  We then sized the colon, it easily accommodated the 29-mm dilator.  We opened an Ethicon 29-mm EEA stapler, placed the anvil into the proximal colon.  I tied down the purse-string thus securing the anvil in place.  We cleaned up some of the surrounding tissue.  My assistant then went below and advanced the stapler up through the anus and rectum up to the staple line.  It easily reached.  She then advanced the spike just anterior to the staple line.  There was a little bit of  discrepancy in size and the upper rectum being a little bit wider than the proximal colon.  Then taking the proximal colon with the anvil in it, I snapped it onto the spike ensuring that the proximal colon mesentery was not twisted.  She then closed the EEA stapler.  I ensured there was nothing between the proximal colon and the rectum.  The stapler was then fired and then opened up.  Two circumferentially intact donuts were obtained.  Rigid proctoscopy was then performed.  The rectum was insufflated with the air and the air distended past the anastomosis and there was no evidence of bubbles and the anastomosis appeared airtight.  The air was evacuated from the rectum.  We then irrigated the pelvis copiously.  I then placed several 3-0 silk sutures along the anterior staple line.  We then exchanged gowns, gloves, instruments and drapes.  I then closed the lower midline incision with two #1 looped PDS, one from above and one from below.  We then went back in laparoscopically to view the closure. There was nothing within the closure.   There was no evidence of injury to surrounding bowel.  The omentum was then placed back into the lower abdomen over the intestines.  Pneumoperitoneum was released.  Remaining trocars were removed.  Local was infiltrated in all incisions.  I then closed all the incisions with 4-0 Monocryl in a subcuticular fashion. Steri-Strips and Band-Aids were placed on the five trocar sites. Honeycomb dressing was placed on the lower midline wound.  The patient was extubated and taken to the recovery room in stable condition.  There were no immediate complications.  The patient tolerated the procedure well.  Needle, instrument, and sponge counts were correct x2.     Leighton Ruff. Redmond Pulling, MD, FACS     EMW/MEDQ  D:  08/29/2013  T:  08/30/2013  Job:  [id number]  cc:   Gatha Mayer, MD,FACG [facility] [place], [place] [id number]  [place] Henrene Pastor, MD

## 2013-08-30 NOTE — Progress Notes (Signed)
Patient ID: Donald Hanson, male   DOB: [birth year], 63 y.o.   MRN: [id number] [facility] Surgery Progress Note:   1 Day Post-Op  Subjective: Mental status is clear.  Reports no flatus yet.  Minimal pain Objective: Vital signs in last 24 hours: Temp:  [97.8 F (36.6 C)-98.3 F (36.8 C)] 98.3 F (36.8 C) (05/09 0547) Pulse Rate:  [65-88] 73 (05/09 0547) Resp:  [11-16] 14 (05/09 0738) BP: (143-174)/(73-93) 145/93 mmHg (05/09 0547) SpO2:  [93 %-100 %] 97 % (05/09 0738) FiO2 (%):  [37 %-46 %] 39 % (05/09 0443) Weight:  [207 lb 14.3 oz (94.3 kg)] 207 lb 14.3 oz (94.3 kg) (05/08 1700)  Intake/Output from previous day: 05/08 0701 - 05/09 0700 In: 3212.5 [P.O.:150; I.V.:2812.5; IV Piggyback:250] Out: 2475 [Urine:2475] Intake/Output this shift:    Physical Exam: Work of breathing is normal.  PCA in place.    Lab Results:  Results for orders placed during the hospital encounter of 08/29/13 (from the past 48 hour(s))  TYPE AND SCREEN     Status: None   Collection Time    08/29/13  7:00 AM      Result Value Ref Range   ABO/RH(D) A POS     Antibody Screen NEG     Sample Expiration 09/01/2013    GLUCOSE, CAPILLARY     Status: Abnormal   Collection Time    08/29/13  7:05 AM      Result Value Ref Range   Glucose-Capillary 104 (*) 70 - 99 mg/dL  GLUCOSE, CAPILLARY     Status: Abnormal   Collection Time    08/29/13 11:31 AM      Result Value Ref Range   Glucose-Capillary 133 (*) 70 - 99 mg/dL   Comment 1 Documented in Chart     Comment 2 Notify RN    GLUCOSE, CAPILLARY     Status: None   Collection Time    08/29/13  6:49 PM      Result Value Ref Range   Glucose-Capillary 90  70 - 99 mg/dL  GLUCOSE, CAPILLARY     Status: None   Collection Time    08/29/13 10:01 PM      Result Value Ref Range   Glucose-Capillary 95  70 - 99 mg/dL  BASIC METABOLIC PANEL     Status: Abnormal   Collection Time    08/30/13  5:26 AM      Result Value Ref Range   Sodium 138  137 - 147 mEq/L   Potassium 4.3  3.7 - 5.3 mEq/L   Chloride 101  96 - 112 mEq/L   CO2 25  19 - 32 mEq/L   Glucose, Bld 103 (*) 70 - 99 mg/dL   BUN 10  6 - 23 mg/dL   Creatinine, Ser 0.94  0.50 - 1.35 mg/dL   Calcium 8.7  8.4 - 10.5 mg/dL   GFR calc non Af Amer 88 (*) >90 mL/min   GFR calc Af Amer >90  >90 mL/min   Comment: (NOTE)     The eGFR has been calculated using the CKD EPI equation.     This calculation has not been validated in all clinical situations.     eGFR's persistently <90 mL/min signify possible Chronic Kidney     Disease.  CBC     Status: None   Collection Time    08/30/13  5:26 AM      Result Value Ref Range   WBC 9.1  4.0 -  10.5 K/uL   RBC 4.79  4.22 - 5.81 MIL/uL   Hemoglobin 13.7  13.0 - 17.0 g/dL   HCT 41.7  39.0 - 52.0 %   MCV 87.1  78.0 - 100.0 fL   MCH 28.6  26.0 - 34.0 pg   MCHC 32.9  30.0 - 36.0 g/dL   RDW 13.0  11.5 - 15.5 %   Platelets 221  150 - 400 K/uL  GLUCOSE, CAPILLARY     Status: None   Collection Time    08/30/13  7:07 AM      Result Value Ref Range   Glucose-Capillary 97  70 - 99 mg/dL    Radiology/Results: No results found.  Anti-infectives: Anti-infectives   Start     Dose/Rate Route Frequency Ordered Stop   08/29/13 1700  ceFAZolin (ANCEF) IVPB 2 g/50 mL premix     2 g 100 mL/hr over 30 Minutes Intravenous 3 times per day 08/29/13 1359 08/29/13 1634   08/29/13 1700  metroNIDAZOLE (FLAGYL) IVPB 500 mg     500 mg 100 mL/hr over 60 Minutes Intravenous Every 8 hours 08/29/13 1359 08/29/13 1819   08/29/13 0622  ceFAZolin (ANCEF) IVPB 2 g/50 mL premix     2 g 100 mL/hr over 30 Minutes Intravenous On call to O.R. 08/29/13 0622 08/29/13 0902   08/29/13 0622  metroNIDAZOLE (FLAGYL) IVPB 500 mg     500 mg 100 mL/hr over 60 Minutes Intravenous On call to O.R. 08/29/13 0622 08/29/13 0903   08/29/13 0622  neomycin (MYCIFRADIN) tablet 1,000 mg  Status:  Discontinued     1,000 mg Oral 3 times per day 08/29/13 0622 08/29/13 0648   08/29/13 0622   erythromycin (E-MYCIN) tablet 1,000 mg  Status:  Discontinued     1,000 mg Oral 3 times per day 08/29/13 0622 08/29/13 5784      Assessment/Plan: Problem List: Patient Active Problem List   Diagnosis Date Noted  . S/P colectomy 08/29/2013  . Anal skin tags 07/23/2013  . External hemorrhoids without mention of complication 69/62/9528  . Elevated amylase and lipase 07/18/2013  . Abdominal pain 06/27/2013  . Essential hypertension, benign 06/27/2013  . Type II or unspecified type diabetes mellitus with unspecified complication, uncontrolled 06/27/2013  . Sigmoid volvulus 06/27/2013    Discontinue foley and mobilize.  Diet to begin when passing flatus.  1 Day Post-Op    LOS: 1 day   Matt B. Hassell Done, MD, [facility] [phone] beeper [phone]  08/30/2013 9:14 AM

## 2013-08-31 LAB — GLUCOSE, CAPILLARY
GLUCOSE-CAPILLARY: 114 mg/dL — AB (ref 70–99)
GLUCOSE-CAPILLARY: 118 mg/dL — AB (ref 70–99)
Glucose-Capillary: 127 mg/dL — ABNORMAL HIGH (ref 70–99)
Glucose-Capillary: 129 mg/dL — ABNORMAL HIGH (ref 70–99)

## 2013-08-31 LAB — BASIC METABOLIC PANEL
BUN: 10 mg/dL (ref 6–23)
CHLORIDE: 98 meq/L (ref 96–112)
CO2: 21 mEq/L (ref 19–32)
Calcium: 8.9 mg/dL (ref 8.4–10.5)
Creatinine, Ser: 0.82 mg/dL (ref 0.50–1.35)
GFR calc non Af Amer: >90 mL/min (ref >90)
Glucose, Bld: 117 mg/dL — ABNORMAL HIGH (ref 70–99)
POTASSIUM: 4.3 meq/L (ref 3.7–5.3)
Sodium: 136 mEq/L — ABNORMAL LOW (ref 137–147)

## 2013-08-31 LAB — CBC
HEMATOCRIT: 41.4 % (ref 39.0–52.0)
HEMOGLOBIN: 13.7 g/dL (ref 13.0–17.0)
MCH: 28.8 pg (ref 26.0–34.0)
MCHC: 33.1 g/dL (ref 30.0–36.0)
MCV: 87.2 fL (ref 78.0–100.0)
Platelets: 220 10*3/uL (ref 150–400)
RBC: 4.75 MIL/uL (ref 4.22–5.81)
RDW: 12.9 % (ref 11.5–15.5)
WBC: 9.2 10*3/uL (ref 4.0–10.5)

## 2013-08-31 MED ORDER — MORPHINE SULFATE 2 MG/ML IJ SOLN: 1.0000 mg | INTRAMUSCULAR | Status: DC | PRN

## 2013-08-31 NOTE — Progress Notes (Addendum)
Dr. Marlou Starks aware via phone pt has passed gas and had 2 loose, grainy bm's today. Abd softer yet remains ditstended. Pt requested clear liquids. No new orders received.

## 2013-08-31 NOTE — Progress Notes (Signed)
Patient ID: Donald Hanson, male   DOB: [birth year], 63 y.o.   MRN: [id number] [facility] Surgery Progress Note:   2 Days Post-Op  Subjective: Mental status is alert.  O2 monitors off.   Objective: Vital signs in last 24 hours: Temp:  [98.2 F (36.8 C)-98.4 F (36.9 C)] 98.2 F (36.8 C) (05/10 0530) Pulse Rate:  [82-94] 94 (05/10 0530) Resp:  [10-18] 16 (05/10 0722) BP: (115-175)/(75-83) 175/82 mmHg (05/10 0530) SpO2:  [93 %-100 %] 94 % (05/10 0722)  Intake/Output from previous day: 05/09 0701 - 05/10 0700 In: 3259.6 [P.O.:720; I.V.:2539.6] Out: 1875 [Urine:1875] Intake/Output this shift:    Physical Exam: Work of breathing is not labored.  Active BS but no flatus yet.  On Entereg. Mildly distended  Lab Results:  Results for orders placed during the hospital encounter of 08/29/13 (from the past 48 hour(s))  GLUCOSE, CAPILLARY     Status: Abnormal   Collection Time    08/29/13 11:31 AM      Result Value Ref Range   Glucose-Capillary 133 (*) 70 - 99 mg/dL   Comment 1 Documented in Chart     Comment 2 Notify RN    GLUCOSE, CAPILLARY     Status: None   Collection Time    08/29/13  6:49 PM      Result Value Ref Range   Glucose-Capillary 90  70 - 99 mg/dL  GLUCOSE, CAPILLARY     Status: None   Collection Time    08/29/13 10:01 PM      Result Value Ref Range   Glucose-Capillary 95  70 - 99 mg/dL  BASIC METABOLIC PANEL     Status: Abnormal   Collection Time    08/30/13  5:26 AM      Result Value Ref Range   Sodium 138  137 - 147 mEq/L   Potassium 4.3  3.7 - 5.3 mEq/L   Chloride 101  96 - 112 mEq/L   CO2 25  19 - 32 mEq/L   Glucose, Bld 103 (*) 70 - 99 mg/dL   BUN 10  6 - 23 mg/dL   Creatinine, Ser 0.94  0.50 - 1.35 mg/dL   Calcium 8.7  8.4 - 10.5 mg/dL   GFR calc non Af Amer 88 (*) >90 mL/min   GFR calc Af Amer >90  >90 mL/min   Comment: (NOTE)     The eGFR has been calculated using the CKD EPI equation.     This calculation has not been validated in all clinical  situations.     eGFR's persistently <90 mL/min signify possible Chronic Kidney     Disease.  CBC     Status: None   Collection Time    08/30/13  5:26 AM      Result Value Ref Range   WBC 9.1  4.0 - 10.5 K/uL   RBC 4.79  4.22 - 5.81 MIL/uL   Hemoglobin 13.7  13.0 - 17.0 g/dL   HCT 41.7  39.0 - 52.0 %   MCV 87.1  78.0 - 100.0 fL   MCH 28.6  26.0 - 34.0 pg   MCHC 32.9  30.0 - 36.0 g/dL   RDW 13.0  11.5 - 15.5 %   Platelets 221  150 - 400 K/uL  GLUCOSE, CAPILLARY     Status: None   Collection Time    08/30/13  7:07 AM      Result Value Ref Range   Glucose-Capillary 97  70 - 99  mg/dL  GLUCOSE, CAPILLARY     Status: None   Collection Time    08/30/13 12:28 PM      Result Value Ref Range   Glucose-Capillary 84  70 - 99 mg/dL  GLUCOSE, CAPILLARY     Status: None   Collection Time    08/30/13  4:54 PM      Result Value Ref Range   Glucose-Capillary 91  70 - 99 mg/dL  BASIC METABOLIC PANEL     Status: Abnormal   Collection Time    08/31/13  5:36 AM      Result Value Ref Range   Sodium 136 (*) 137 - 147 mEq/L   Potassium 4.3  3.7 - 5.3 mEq/L   Chloride 98  96 - 112 mEq/L   CO2 21  19 - 32 mEq/L   Glucose, Bld 117 (*) 70 - 99 mg/dL   BUN 10  6 - 23 mg/dL   Creatinine, Ser 0.82  0.50 - 1.35 mg/dL   Calcium 8.9  8.4 - 10.5 mg/dL   GFR calc non Af Amer >90  >90 mL/min   GFR calc Af Amer >90  >90 mL/min   Comment: (NOTE)     The eGFR has been calculated using the CKD EPI equation.     This calculation has not been validated in all clinical situations.     eGFR's persistently <90 mL/min signify possible Chronic Kidney     Disease.  CBC     Status: None   Collection Time    08/31/13  5:36 AM      Result Value Ref Range   WBC 9.2  4.0 - 10.5 K/uL   RBC 4.75  4.22 - 5.81 MIL/uL   Hemoglobin 13.7  13.0 - 17.0 g/dL   HCT 41.4  39.0 - 52.0 %   MCV 87.2  78.0 - 100.0 fL   MCH 28.8  26.0 - 34.0 pg   MCHC 33.1  30.0 - 36.0 g/dL   RDW 12.9  11.5 - 15.5 %   Platelets 220  150 - 400  K/uL  GLUCOSE, CAPILLARY     Status: Abnormal   Collection Time    08/31/13  7:23 AM      Result Value Ref Range   Glucose-Capillary 127 (*) 70 - 99 mg/dL    Radiology/Results: No results found.  Anti-infectives: Anti-infectives   Start     Dose/Rate Route Frequency Ordered Stop   08/29/13 1700  ceFAZolin (ANCEF) IVPB 2 g/50 mL premix     2 g 100 mL/hr over 30 Minutes Intravenous 3 times per day 08/29/13 1359 08/29/13 1634   08/29/13 1700  metroNIDAZOLE (FLAGYL) IVPB 500 mg     500 mg 100 mL/hr over 60 Minutes Intravenous Every 8 hours 08/29/13 1359 08/29/13 1819   08/29/13 0622  ceFAZolin (ANCEF) IVPB 2 g/50 mL premix     2 g 100 mL/hr over 30 Minutes Intravenous On call to O.R. 08/29/13 0622 08/29/13 0902   08/29/13 0622  metroNIDAZOLE (FLAGYL) IVPB 500 mg     500 mg 100 mL/hr over 60 Minutes Intravenous On call to O.R. 08/29/13 0622 08/29/13 0903   08/29/13 0622  neomycin (MYCIFRADIN) tablet 1,000 mg  Status:  Discontinued     1,000 mg Oral 3 times per day 08/29/13 0622 08/29/13 0648   08/29/13 0622  erythromycin (E-MYCIN) tablet 1,000 mg  Status:  Discontinued     1,000 mg Oral 3 times per day 08/29/13 0622 08/29/13  0648      Assessment/Plan: Problem List: Patient Active Problem List   Diagnosis Date Noted  . S/P colectomy 08/29/2013  . Anal skin tags 07/23/2013  . External hemorrhoids without mention of complication 94/12/500  . Elevated amylase and lipase 07/18/2013  . Abdominal pain 06/27/2013  . Essential hypertension, benign 06/27/2013  . Type II or unspecified type diabetes mellitus with unspecified complication, uncontrolled 06/27/2013  . 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 Hassell Done, MD, [facility] [phone] beeper [phone]  08/31/2013 9:00 AM

## 2013-09-01 ENCOUNTER — Encounter (HOSPITAL_COMMUNITY): Payer: Self-pay | Admitting: General Surgery

## 2013-09-01 LAB — CBC
HCT: 41.2 % (ref 39.0–52.0)
HEMOGLOBIN: 13.8 g/dL (ref 13.0–17.0)
MCH: 28.9 pg (ref 26.0–34.0)
MCHC: 33.5 g/dL (ref 30.0–36.0)
MCV: 86.4 fL (ref 78.0–100.0)
Platelets: 237 10*3/uL (ref 150–400)
RBC: 4.77 MIL/uL (ref 4.22–5.81)
RDW: 12.9 % (ref 11.5–15.5)
WBC: 8.6 10*3/uL (ref 4.0–10.5)

## 2013-09-01 LAB — BASIC METABOLIC PANEL
BUN: 11 mg/dL (ref 6–23)
CO2: 23 mEq/L (ref 19–32)
CREATININE: 0.88 mg/dL (ref 0.50–1.35)
Calcium: 9 mg/dL (ref 8.4–10.5)
Chloride: 101 mEq/L (ref 96–112)
GFR calc non Af Amer: >90 mL/min (ref >90)
Glucose, Bld: 120 mg/dL — ABNORMAL HIGH (ref 70–99)
POTASSIUM: 4.4 meq/L (ref 3.7–5.3)
SODIUM: 136 meq/L — AB (ref 137–147)

## 2013-09-01 LAB — GLUCOSE, CAPILLARY
GLUCOSE-CAPILLARY: 140 mg/dL — AB (ref 70–99)
GLUCOSE-CAPILLARY: 105 mg/dL — AB (ref 70–99)
Glucose-Capillary: 127 mg/dL — ABNORMAL HIGH (ref 70–99)
Glucose-Capillary: 113 mg/dL — ABNORMAL HIGH (ref 70–99)

## 2013-09-01 MED ORDER — PANTOPRAZOLE SODIUM 40 MG PO TBEC
40.0000 mg | DELAYED_RELEASE_TABLET | Freq: Every day | ORAL | Status: DC
  Administered 2013-09-01: 40 mg via ORAL
  Filled 2013-09-01 (×2): qty 1

## 2013-09-01 MED ORDER — OXYCODONE-ACETAMINOPHEN 5-325 MG PO TABS: 1.0000 | ORAL_TABLET | ORAL | Status: DC | PRN

## 2013-09-01 NOTE — Progress Notes (Signed)
This patient is receiving Protonix. Based on criteria approved by the Pharmacy and Therapeutics Committee, this medication is being converted to the equivalent oral dose form. These criteria include:   . The patient is eating (either orally or per tube) and/or has been taking other orally administered medications for at least 24 hours.  . This patient has no evidence of active gastrointestinal bleeding or impaired GI absorption (gastrectomy, short bowel, patient on TNA or NPO).   If you have questions about this conversion, please contact the pharmacy department.  Kara Mead, [facility] 09/01/2013 9:51 AM

## 2013-09-01 NOTE — Progress Notes (Signed)
3 Days Post-Op  Subjective: Doing well. No n/v. Tolerated clears. +BMs  Objective: Vital signs in last 24 hours: Temp:  [97.5 F (36.4 C)-98.8 F (37.1 C)] 97.5 F (36.4 C) (05/11 0520) Pulse Rate:  [85-89] 86 (05/11 0520) Resp:  [18] 18 (05/11 0520) BP: (137-162)/(85-89) 147/85 mmHg (05/11 0520) SpO2:  [96 %-97 %] 96 % (05/11 0520) Last BM Date: 08/31/13  Intake/Output from previous day: 05/10 0701 - 05/11 0700 In: 3500 [I.V.:3500] Out: 1975 [Urine:1975] Intake/Output this shift:    Alert, nad cta b/l Reg Soft, protuberant (baseline), +BS. Incisions c/d/i No edema  Lab Results:   Recent Labs  08/31/13 0536 09/01/13 0415  WBC 9.2 8.6  HGB 13.7 13.8  HCT 41.4 41.2  PLT 220 237   BMET  Recent Labs  08/31/13 0536 09/01/13 0415  NA 136* 136*  K 4.3 4.4  CL 98 101  CO2 21 23  GLUCOSE 117* 120*  BUN 10 11  CREATININE 0.82 0.88  CALCIUM 8.9 9.0   PT/INR No results found for this basename: LABPROT, INR,  in the last 72 hours ABG No results found for this basename: PHART, PCO2, PO2, HCO3,  in the last 72 hours  Studies/Results: No results found.  Anti-infectives: Anti-infectives   Start     Dose/Rate Route Frequency Ordered Stop   08/29/13 1700  ceFAZolin (ANCEF) IVPB 2 g/50 mL premix     2 g 100 mL/hr over 30 Minutes Intravenous 3 times per day 08/29/13 1359 08/29/13 1634   08/29/13 1700  metroNIDAZOLE (FLAGYL) IVPB 500 mg     500 mg 100 mL/hr over 60 Minutes Intravenous Every 8 hours 08/29/13 1359 08/29/13 1819   08/29/13 0622  ceFAZolin (ANCEF) IVPB 2 g/50 mL premix     2 g 100 mL/hr over 30 Minutes Intravenous On call to O.R. 08/29/13 0622 08/29/13 0902   08/29/13 0622  metroNIDAZOLE (FLAGYL) IVPB 500 mg     500 mg 100 mL/hr over 60 Minutes Intravenous On call to O.R. 08/29/13 0622 08/29/13 0903   08/29/13 0622  neomycin (MYCIFRADIN) tablet 1,000 mg  Status:  Discontinued     1,000 mg Oral 3 times per day 08/29/13 0622 08/29/13 0648   08/29/13 0622  erythromycin (E-MYCIN) tablet 1,000 mg  Status:  Discontinued     1,000 mg Oral 3 times per day 08/29/13 0622 08/29/13 0648      Assessment/Plan: s/p Procedure(s): LAPAROSCOPIC assisted COLECTOMY (N/A)  fulls for lunch. If tolerates lunch and dinner with fulls then will have nurse place order for low residue diet for Tuesday am -ambulate, is -cont lovenox -BP ok -probable discharge tuesday  Leighton Ruff. Redmond Pulling, MD, FACS General, Bariatric, & Minimally Invasive Surgery [facility], [place]   LOS: 3 days    [name] 09/01/2013

## 2013-09-02 LAB — GLUCOSE, CAPILLARY: GLUCOSE-CAPILLARY: 117 mg/dL — AB (ref 70–99)

## 2013-09-02 MED ORDER — OXYCODONE-ACETAMINOPHEN 5-325 MG PO TABS: 1.0000 | ORAL_TABLET | ORAL | Status: DC | PRN

## 2013-09-02 NOTE — Discharge Summary (Signed)
Physician Discharge Summary  [name] HEN:[id number] DOB: 1950-05-15 DOA: 08/29/2013  PCP: Marylene Land, MD  Admit date: 08/29/2013 Discharge date: 09/02/2013  Recommendations for Outpatient Follow-up:  1.   Follow-up Information   Follow up with Donald Curry, MD On 09/17/2013. (10:15 AM , For wound re-check)    Specialty:  General Surgery   Contact information:   [place] [address] [phone]      Discharge Diagnoses:  Principal Problem:   h/o sigmoid volvulus Active Problems:   Essential hypertension, benign   Type II or unspecified type diabetes mellitus with unspecified complication, uncontrolled   S/P colectomy  Surgical Procedure: laparoscopic assisted sigmoid colectomy  Discharge Condition: good Disposition: home  Diet recommendation: low fiber diet  Filed Weights   08/29/13 1700  Weight: 207 lb 14.3 oz (94.3 kg)    History of present illness: 63 year old Caucasian male comes in for followup after his recent hospitalization from March 6 through the ninth for acute onset of right-sided abdominal pain. He was found to have a very redundant sigmoid colon and a sigmoid volvulus. He underwent colonoscopic partial decompression. He ultimately resolved without having to have active intervention. He is here today to discuss elective colectomy in order to prevent recurrent sigmoid volvulus. Since discharge from the hospital he states he has been doing well. He denies any fevers or chills. He denies any nausea or vomiting. He denies any abdominal pain. He reports normal bowel movements. He did undergo a colonoscopy last week which was normal except for very redundant colon.  Hospital Course:  Pt taken to the OR for laparoscopic sigmoid colectomy. He was maintained on perioperative entereg. He received subcu heparin preop and was continued on lovnenox postoperatively for VTE prophylaxis. On POD 1, he was given ice chips. On POD 2, he was  started on clears after having flatus and a BM. His foley was removed on POD 2. His diet was gradually advanced throughout POD 3. On POD 4 he was tolerating solids, ambulating without difficulty, pain controlled on oral pain meds. His wounds were stable.   BP 138/82  Pulse 77  Temp(Src) 98.1 F (36.7 C) (Oral)  Resp 16  Ht 5\' 10"  (1.778 m)  Wt 207 lb 14.3 oz (94.3 kg)  BMI 29.83 kg/m2  SpO2 97%  Gen: alert, NAD, non-toxic appearing; obese.  Pupils: equal, no scleral icterus Pulm: Lungs clear to auscultation, symmetric chest rise CV: regular rate and rhythm Abd: soft, nontender, nondistended.  No cellulitis. No incisional hernia Ext: no edema, no calf tenderness Skin: no rash, no jaundice     Discharge Instructions      Discharge Orders   Future Appointments Provider Department Dept Phone   09/17/2013 10:15 AM Donald Curry, MD [facility] Surgery, [place] [id number]   Future Orders Complete By Expires   Diet Carb Modified  As directed    Discharge instructions  As directed    Increase activity slowly  As directed        Medication List         atorvastatin 80 MG tablet  Commonly known as:  LIPITOR  Take 80 mg by mouth daily.     Fish Oil 1000 MG Cpdr  Take 1,000 mg by mouth 2 (two) times daily.     glipiZIDE 5 MG 24 hr tablet  Commonly known as:  GLUCOTROL XL  Take 5 mg by mouth 2 (two) times daily.     loratadine 10 MG  tablet  Commonly known as:  CLARITIN  Take 10 mg by mouth daily.     losartan 100 MG tablet  Commonly known as:  COZAAR  Take 100 mg by mouth every morning.     metFORMIN 500 MG (MOD) 24 hr tablet  Commonly known as:  GLUMETZA  Take 1,000 mg by mouth 2 (two) times daily with a meal.     oxyCODONE-acetaminophen 5-325 MG per tablet  Commonly known as:  PERCOCET/ROXICET  Take 1-2 tablets by mouth every 4 (four) hours as needed for moderate pain.     sitaGLIPtin 100 MG tablet  Commonly known as:  JANUVIA  Take 100 mg by mouth daily.      Vitamin D (Cholecalciferol) 1000 UNITS Tabs  Take by mouth 2 (two) times daily.       Follow-up Information   Follow up with Donald Curry, MD On 09/17/2013. (10:15 AM , For wound re-check)    Specialty:  General Surgery   Contact information:   [place]  [address] [phone]        The results of significant diagnostics from this hospitalization (including imaging, microbiology, ancillary and laboratory) are listed below for reference.    Significant Diagnostic Studies: No results found.  Microbiology: No results found for this or any previous visit (from the past 240 hour(s)).   Labs: Basic Metabolic Panel:  Recent Labs Lab 08/30/13 0526 08/31/13 0536 09/01/13 0415  NA 138 136* 136*  K 4.3 4.3 4.4  CL 101 98 101  CO2 25 21 23   GLUCOSE 103* 117* 120*  BUN 10 10 11   CREATININE 0.94 0.82 0.88  CALCIUM 8.7 8.9 9.0   Liver Function Tests: No results found for this basename: AST, ALT, ALKPHOS, BILITOT, PROT, ALBUMIN,  in the last 168 hours No results found for this basename: LIPASE, AMYLASE,  in the last 168 hours No results found for this basename: AMMONIA,  in the last 168 hours CBC:  Recent Labs Lab 08/30/13 0526 08/31/13 0536 09/01/13 0415  WBC 9.1 9.2 8.6  HGB 13.7 13.7 13.8  HCT 41.7 41.4 41.2  MCV 87.1 87.2 86.4  PLT 221 220 237   Cardiac Enzymes: No results found for this basename: CKTOTAL, CKMB, CKMBINDEX, TROPONINI,  in the last 168 hours BNP: BNP (last 3 results) No results found for this basename: PROBNP,  in the last 8760 hours CBG:  Recent Labs Lab 09/01/13 0740 09/01/13 1219 09/01/13 1704 09/01/13 2148 09/02/13 0722  GLUCAP 127* 113* 140* 105* 117*    Principal Problem:   h/o sigmoid volvulus Active Problems:   Essential hypertension, benign   Type II or unspecified type diabetes mellitus with unspecified complication, uncontrolled   S/P colectomy   Time coordinating discharge: 15  minutes  Signed:  [name], MD [facility], [place] [phone] 09/02/2013, 10:42 AM

## 2013-09-02 NOTE — Discharge Instructions (Signed)
[place] [facility], [place] [id number]  OPEN ABDOMINAL SURGERY: POST OP INSTRUCTIONS  Always review your discharge instruction sheet given to you by the facility where your surgery was performed.  IF YOU HAVE DISABILITY OR FAMILY LEAVE FORMS, YOU MUST BRING THEM TO THE OFFICE FOR PROCESSING.  PLEASE DO NOT GIVE THEM TO YOUR DOCTOR.  1. A prescription for pain medication may be given to you upon discharge.  Take your pain medication as prescribed, if needed.  If narcotic pain medicine is not needed, then you may take acetaminophen (Tylenol) or ibuprofen (Advil) as needed. 2. Take your usually prescribed medications unless otherwise directed. 3. If you need a refill on your pain medication, please contact your pharmacy. They will contact our office to request authorization.  Prescriptions will not be filled after 5pm or on week-ends. 4. You should follow a light diet the first few days after arrival home, such as soup and crackers, pudding, etc.unless your doctor has advised otherwise. A high-fiber, low fat diet can be resumed as tolerated.   Be sure to include lots of fluids daily. Most patients will experience some swelling and bruising in the incision area.  Ice packs will help.  Swelling and bruising can take several days to resolve 5. Most patients will experience some swelling and bruising in the area of the incision. Ice pack will help. Swelling and bruising can take several days to resolve..  6. It is common to experience some constipation if taking pain medication after surgery.  Increasing fluid intake and taking a stool softener will usually help or prevent this problem from occurring.  A mild laxative (Milk of Magnesia or Miralax) should be taken according to package directions if there are no bowel movements after 48 hours. 7.  You may have steri-strips (small skin tapes) in place directly over the incision.  These strips should be left on the skin for 7-10 days.  If your  surgeon used skin glue on the incision, you may shower in 24 hours.  The glue will flake off over the next 2-3 weeks.  Any sutures or staples will be removed at the office during your follow-up visit. You may find that a light gauze bandage over your incision may keep your staples from being rubbed or pulled. You may shower and replace the bandage daily. 8. ACTIVITIES:  You may resume regular (light) daily activities beginning the next day--such as daily self-care, walking, climbing stairs--gradually increasing activities as tolerated.  You may have sexual intercourse when it is comfortable.  Refrain from any heavy lifting or straining until approved by your doctor. a. You may drive when you no longer are taking prescription pain medication, you can comfortably wear a seatbelt, and you can safely maneuver your car and apply brakes b. Return to Work: _ 65. You should see your doctor in the office for a follow-up appointment approximately two weeks after your surgery.  Make sure that you call for this appointment within a day or two after you arrive home to insure a convenient appointment time. OTHER INSTRUCTIONS:    WHEN TO CALL YOUR DOCTOR: 1. Fever over 101.0 2. Inability to urinate 3. Nausea and/or vomiting 4. Extreme swelling or bruising 5. Continued bleeding from incision. 6. Increased pain, redness, or drainage from the incision. 7. Difficulty swallowing or breathing 8. Muscle cramping or spasms. 9. Numbness or tingling in hands or feet or around lips.  The clinic staff is available to answer your questions  during regular business hours.  Please dont hesitate to call and ask to speak to one of the nurses if you have concerns.  For further questions, please visit [email or web address]

## 2013-09-02 NOTE — Care Management Note (Signed)
    Page 1 of 1   09/02/2013     10:47:57 AM CARE MANAGEMENT NOTE 09/02/2013  Patient:  Donald Hanson, Donald Hanson   Account Number:  [id number]  Date Initiated:  09/02/2013  Documentation initiated by:  Sunday Spillers  Subjective/Objective Assessment:   63 yo male admitted s/p colectomy. PTA lived at home alone.     Action/Plan:   Home when stable   Anticipated DC Date:  09/02/2013   Anticipated DC Plan:  [place]  CM consult      Choice offered to / List presented to:             Status of service:  Completed, signed off Medicare Important Message given?   (If response is "NO", the following Medicare IM given date fields will be blank) Date Medicare IM given:   Date Additional Medicare IM given:    Discharge Disposition:  HOME/SELF CARE  Per UR Regulation:  Reviewed for med. necessity/level of care/duration of stay  If discussed at [place] of Stay Meetings, dates discussed:    Comments:

## 2013-09-17 ENCOUNTER — Encounter (INDEPENDENT_AMBULATORY_CARE_PROVIDER_SITE_OTHER): Payer: Self-pay | Admitting: General Surgery

## 2013-09-17 ENCOUNTER — Ambulatory Visit (INDEPENDENT_AMBULATORY_CARE_PROVIDER_SITE_OTHER): Payer: 59 | Admitting: General Surgery

## 2013-09-17 VITALS — BP 136/80 | HR 68 | Temp 97.5°F | Ht 70.0 in | Wt 206.0 lb

## 2013-09-17 DIAGNOSIS — Z9049 Acquired absence of other specified parts of digestive tract: Secondary | ICD-10-CM

## 2013-09-17 DIAGNOSIS — Z9889 Other specified postprocedural states: Secondary | ICD-10-CM

## 2013-09-17 NOTE — Patient Instructions (Signed)
Can get into pool Can start light lifting (<25 pounds) on/after 6/8 Can resume full lifting after 6/18

## 2013-09-18 NOTE — Progress Notes (Signed)
Subjective:     Patient ID: Donald Hanson, male   DOB: Sep 03, 1950, 63 y.o.   MRN: [id number]  HPI 63 year old Caucasian male comes in today after being hospitalized for elective sigmoid colectomy because of history of sigmoid volvulus. He underwent laparoscopic assisted sigmoid colectomy on May 8. He was discharged home on May 12. He states he is doing well. He denies any fever or chills. He denies any nausea or vomiting. He denies any diarrhea or constipation. His main complaint is low energy and fatigue. He denies any melena or hematochezia. He didn't take any narcotics when he went home. He just took plain Tylenol.  Review of Systems     Objective:   Physical Exam BP 136/80  Pulse 68  Temp(Src) 97.5 F (36.4 C)  Ht 5\' 10"  (1.778 m)  Wt 206 lb (93.441 kg)  BMI 29.56 kg/m2  Gen: alert, NAD, non-toxic appearing Pupils: equal, no scleral icterus Pulm: Lungs clear to auscultation, symmetric chest rise CV: regular rate and rhythm Abd: soft, nontender, nondistended. Well-healed trocar sites. No cellulitis. No incisional hernia Ext: no edema, no calf tenderness Skin: no rash, no jaundice     Assessment:     Status post laparoscopic-assisted sigmoid colectomy for history of sigmoid volvulus     Plan:     We reviewed his pathology report and he was given a copy of it. It showed colonic wall with serosal adhesions consistent with volvulus. Her mother then he should not do any heavy lifting until after June 19. He was given a note to return to work for full duty on that date. I advised him that energy should improve with time. I advised him to do light cardiovascular activity right now. Followup in 6 weeks  Leighton Ruff. Redmond Pulling, MD, FACS General, Bariatric, & Minimally Invasive Surgery [facility], [place]

## 2013-11-05 ENCOUNTER — Encounter (INDEPENDENT_AMBULATORY_CARE_PROVIDER_SITE_OTHER): Payer: Self-pay | Admitting: General Surgery

## 2013-11-05 ENCOUNTER — Ambulatory Visit (INDEPENDENT_AMBULATORY_CARE_PROVIDER_SITE_OTHER): Payer: 59 | Admitting: General Surgery

## 2013-11-05 VITALS — BP 130/84 | HR 74 | Temp 97.4°F | Ht 70.0 in | Wt 208.0 lb

## 2013-11-05 DIAGNOSIS — Z09 Encounter for follow-up examination after completed treatment for conditions other than malignant neoplasm: Secondary | ICD-10-CM

## 2013-11-05 NOTE — Progress Notes (Signed)
Subjective:     Patient ID: Donald Hanson, male   DOB: Jul 22, 1950, 63 y.o.   MRN: [id number]  HPI 63 year old Caucasian male comes in for long-term followup after undergoing sigmoid colectomy on May 8 for a history of sigmoid volvulus. I last saw him May 27. He has returned to work. He denies any abdominal pain. He denies any fever, chills, nausea, vomiting, diarrhea or constipation. He reports a good appetite. He denies any melena or hematochezia  Review of Systems     Objective:   Physical Exam BP 130/84  Pulse 74  Temp(Src) 97.4 F (36.3 C)  Ht 5\' 10"  (1.778 m)  Wt 208 lb (94.348 kg)  BMI 29.84 kg/m2  Gen: alert, NAD, non-toxic appearing Pupils: equal, no scleral icterus Pulm: Lungs clear to auscultation, symmetric chest rise CV: regular rate and rhythm Abd: soft, nontender, nondistended. Well-healed trocar sites. No cellulitis. No incisional hernia. Upper midline diastasis Ext: no edema, no calf tenderness Skin: no rash,     Assessment:     Status post laparoscopic-assisted sigmoid colectomy for sigmoid volvulus     Plan:     Overall I think he is doing great. There is no sign of complications. He is released to full activities. Followup as needed.  Leighton Ruff. Redmond Pulling, MD, FACS General, Bariatric, & Minimally Invasive Surgery [facility], [place]

## 2014-04-26 IMAGING — CR DG ABDOMEN 1V
2 series · 2 of 2 positions shown · non-contrast
Comparison: 06/27/2013

CLINICAL DATA: Colonoscopy yesterday

EXAM:
ABDOMEN - 1 VIEW

[x abdomen supine (1 of 2)]
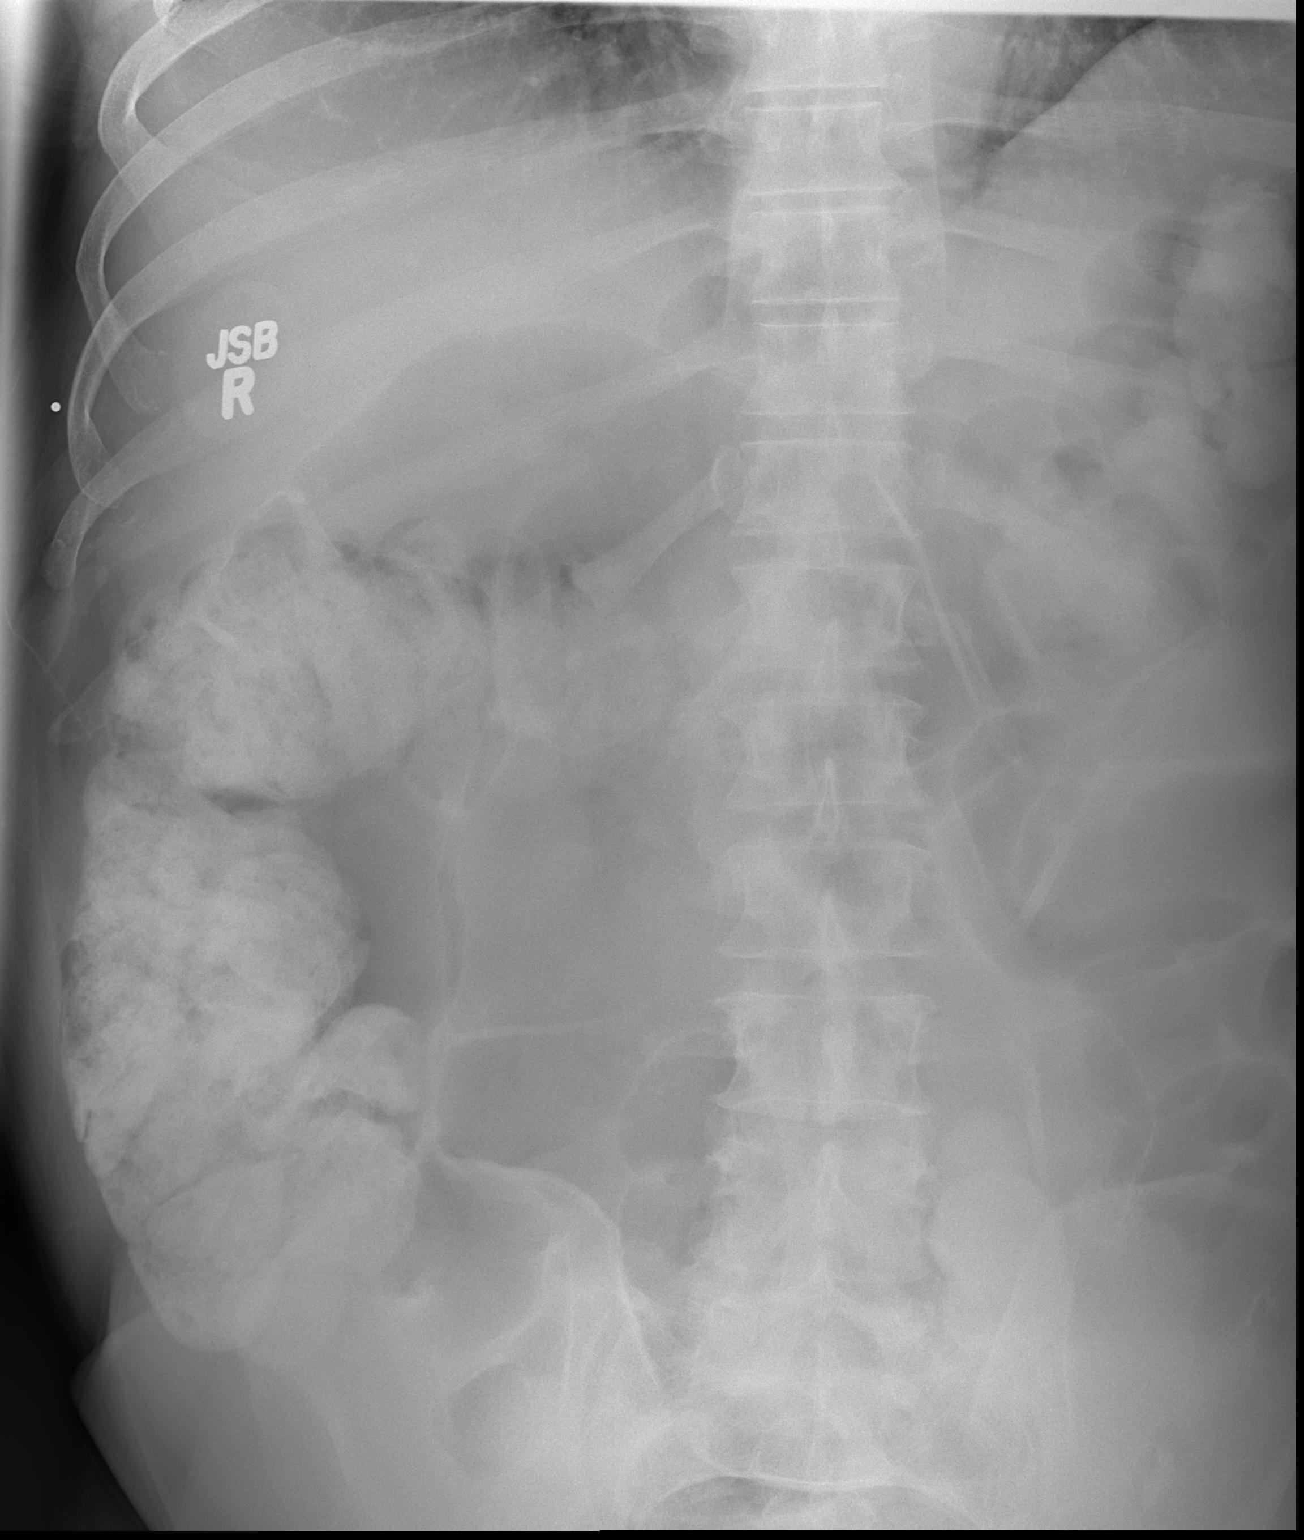

[x abdomen supine (2 of 2)]
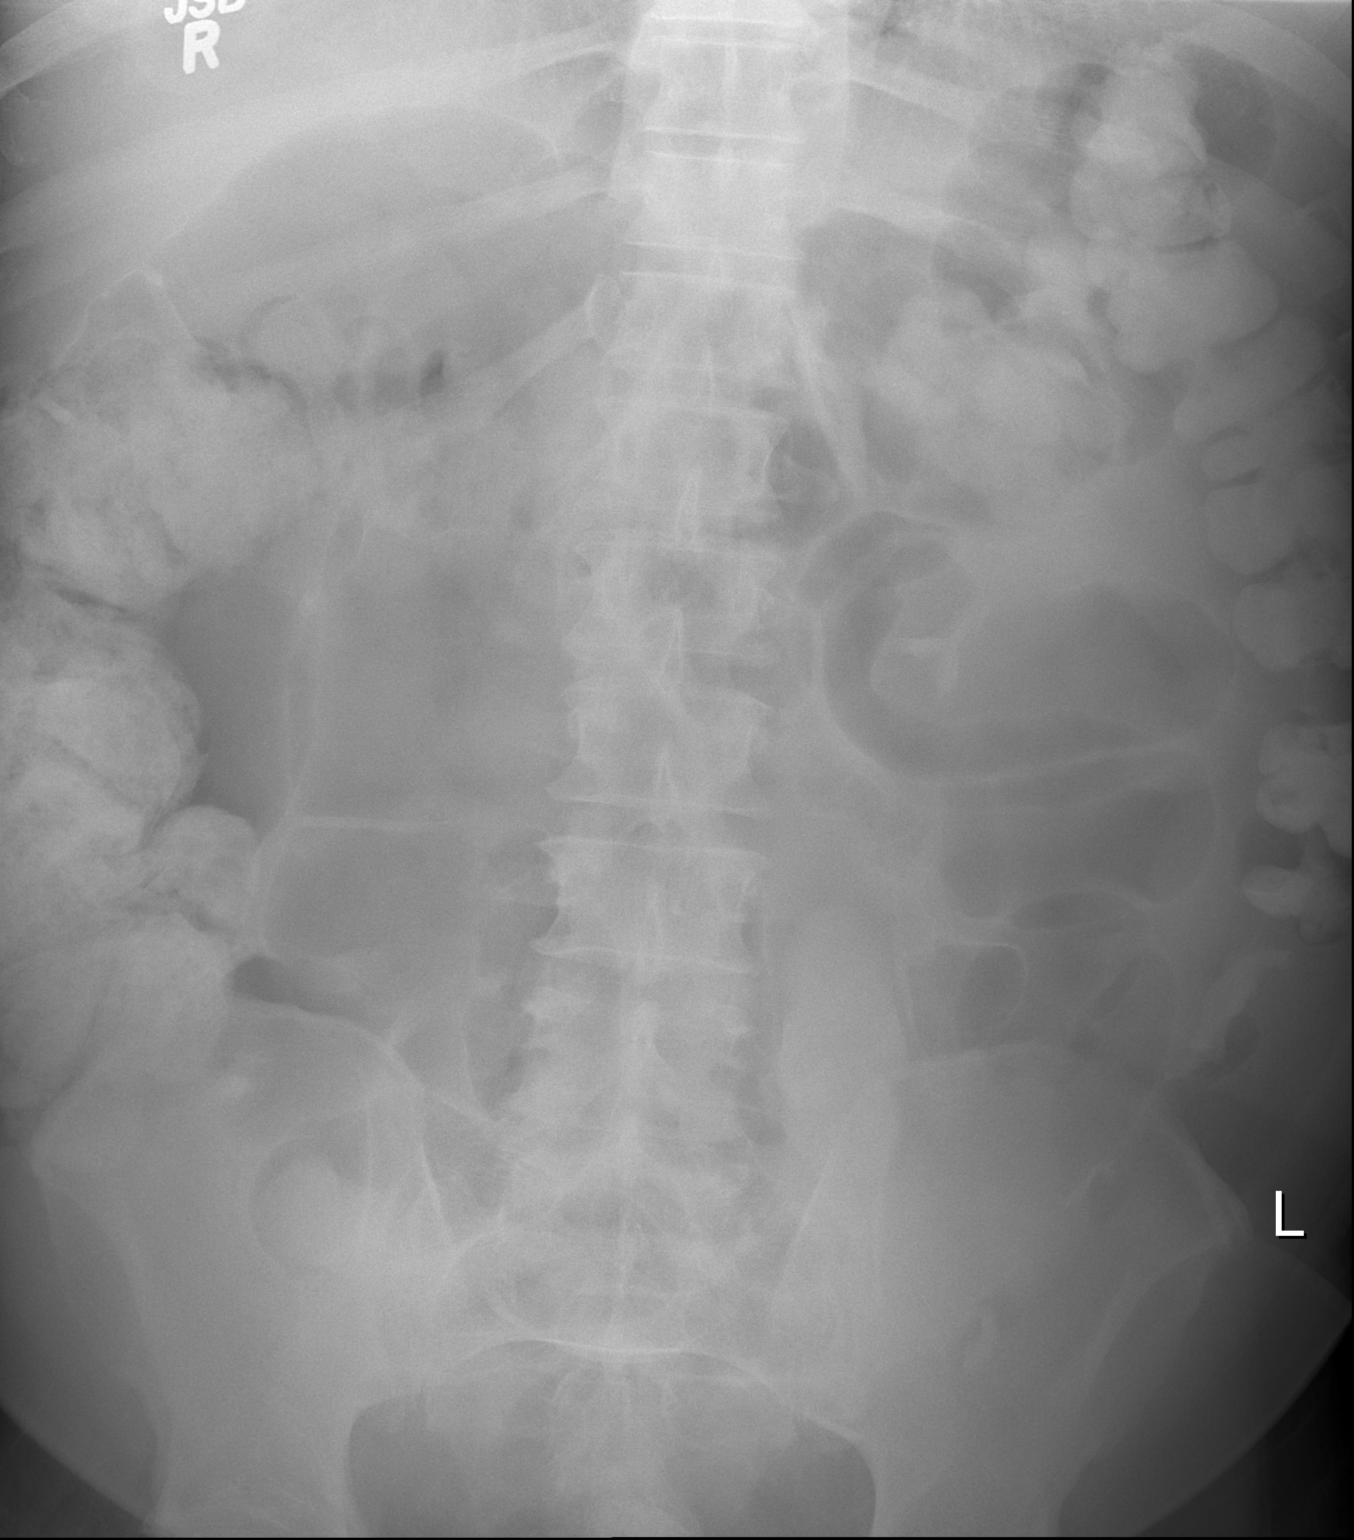

[2 of 2 positions shown; findings below may reference images not displayed]

FINDINGS: Gaseous distension of the large bowel loops again noted. There has
been decrease in small bowel gas. Enteric contrast material within
the colon is again noted.The bowel gas pattern is normal. No
radio-opaque calculi or other significant radiographic abnormality
are seen.
IMPRESSION: Decrease in gaseous distension of bowel loops.

## 2014-08-10 ENCOUNTER — Other Ambulatory Visit: Payer: Self-pay | Admitting: Family Medicine

## 2014-09-02 ENCOUNTER — Encounter: Payer: Self-pay | Admitting: *Deleted

## 2014-09-02 ENCOUNTER — Encounter: Payer: 59 | Attending: Family Medicine | Admitting: *Deleted

## 2014-09-02 VITALS — Ht 70.0 in | Wt 200.0 lb

## 2014-09-02 DIAGNOSIS — Z713 Dietary counseling and surveillance: Secondary | ICD-10-CM | POA: Insufficient documentation

## 2014-09-02 DIAGNOSIS — E119 Type 2 diabetes mellitus without complications: Secondary | ICD-10-CM | POA: Insufficient documentation

## 2014-09-02 NOTE — Progress Notes (Signed)
Diabetes Self-Management Education  Visit Type:  Initial DSME and insulin instruction  Appt. Start Time: 1400 Appt. End Time: 9833  09/02/2014  Mr. Keenan Bachelor, identified by name and date of birth, is a 64 y.o. male with a diagnosis of Diabetes: Type 2.  Other people present during visit:  Patient . Ludwig Clarks will be starting Lantus 10units daily. He received DSME as well as insulin administration instruction. I have stressed the need for regular meals and snacks, to carry glucose tablets with him at all times, and the need for hydration. Eddie works outside and is very active. He will be retiring in August. I have stressed the need to continue his activity following retirement. He plans to plan more Golf! He was receptive to education and seems ready to start the insulin. He also understands the complications of poorly managed glucose. I contacted Dr. Billy Coast office by phone to notify that Ludwig Clarks is ready to start his insulin.  ASSESSMENT  Height 5\' 10"  (1.778 m), weight 200 lb (90.719 kg). Body mass index is 28.7 kg/(m^2).  Initial Visit Information:  Are you currently following a meal plan?: Yes What type of meal plan do you follow?: low carb high protein Are you taking your medications as prescribed?: Yes Are you checking your feet?: Yes How many days per week are you checking your feet?: 7 How often do you need to have someone help you when you read instructions, pamphlets, or other written materials from your doctor or pharmacy?: 1 - Never What is the last grade level you completed in school?: bachelor degree  Psychosocial:   Patient Belief/Attitude about Diabetes: Motivated to manage diabetes Self-care barriers: None Self-management support: Doctor's office, Friends, CDE visits Other persons present: Patient Patient Concerns: Glycemic Control Special Needs: None Preferred Learning Style: No preference indicated Learning Readiness: Change in progress  Complications:   How  often do you check your blood sugar?: 1-2 times/day Fasting Blood glucose range (mg/dL): 130-179, >200, 180-200 (825-053) Postprandial Blood glucose range (mg/dL): 70-129, >200, 130-179, 180-200 (105-275) Have you had a dilated eye exam in the past 12 months?: Yes Have you had a dental exam in the past 12 months?: Yes  Diet Intake:  Breakfast: granola bar, fruit, black coffee / eggs, toast, grits Snack (morning): none Lunch: [place] sandwich, brown bread, water Snack (afternoon): none Dinner: lean cuisine, chicken, fish. green beans (Mrs. Deliah Boston), lettuce, tomato, cucumber sandwich Snack (evening): popcorn / pretzels / Diet Soda Beverage(s): diet soda, water  Exercise:  Exercise: Light (walking / raking leaves)  Individualized Plan for Diabetes Self-Management Training:   Learning Objective:  Patient will have a greater understanding of diabetes self-management. Patient education plan per assessed needs and concerns is to attend individual sessions      Education Topics Reviewed with Patient Today:  Definition of diabetes, type 1 and 2, and the diagnosis of diabetes Role of diet in the treatment of diabetes and the relationship between the three main macronutrients and blood glucose level, Food label reading, portion sizes and measuring food., Carbohydrate counting, Meal options for control of blood glucose level and chronic complications. Role of exercise on diabetes management, blood pressure control and cardiac health. Taught/reviewed insulin injection, site rotation, insulin storage and needle disposal., Reviewed patients medication for diabetes, action, purpose, timing of dose and side effects. Purpose and frequency of SMBG. Taught treatment of hypoglycemia - the 15 rule. Relationship between chronic complications and blood glucose control, Assessed and discussed foot care and prevention of foot problems, Dental  care Role of stress on diabetes, Worked with patient to identify  barriers to care and solutions     PATIENTS GOALS/Plan (Developed by the patient):  Nutrition: General guidelines for healthy choices and portions discussed Physical Activity: Exercise 5-7 days per week, 30 minutes per day Monitoring : test my blood glucose as discussed (note x per day with comment) (Test FBS and before dinner) Reducing Risk: do foot checks daily, get labs drawn, treat hypoglycemia with 15 grams of carbs if blood glucose less than 70mg /dL   Patient Instructions  Plan:  Aim for 3 Carb Choices per meal (45 grams) +/- 1 either way  Aim for 0-15 Carbs per snack if hungry  Include protein in moderation with your meals and snacks Consider reading food labels for Total Carbohydrate and Fat Grams of foods Continue your activity level after retirement Continue checking BG at alternate times per day as directed by MD  Continue taking medication  as directed by MD  ALWAYS [place]. ONLY TAKE OUT ONE PEN AT A TIME. ROTATE AROUND THE ABDOMEN FOR INJECTION SITES  Expected Outcomes:  Demonstrated interest in learning. Expect positive outcomes  Education material provided: Living Well with Diabetes, Meal plan card, My Plate and Snack sheet  If problems or questions, patient to contact team via:  Phone  Future DSME appointment:

## 2014-09-02 NOTE — Patient Instructions (Signed)
Plan:  Aim for 3 Carb Choices per meal (45 grams) +/- 1 either way  Aim for 0-15 Carbs per snack if hungry  Include protein in moderation with your meals and snacks Consider reading food labels for Total Carbohydrate and Fat Grams of foods Continue your activity level after retirement Continue checking BG at alternate times per day as directed by MD  Continue taking medication  as directed by MD  [place]. ONLY TAKE OUT ONE PEN AT A TIME. ROTATE AROUND THE ABDOMEN FOR INJECTION SITES

## 2015-07-28 ENCOUNTER — Other Ambulatory Visit: Payer: Self-pay | Admitting: Family Medicine

## 2015-07-28 ENCOUNTER — Ambulatory Visit
Admission: RE | Admit: 2015-07-28 | Discharge: 2015-07-28 | Disposition: A | Discharge status: Home / Self Care | Payer: 59 | Source: Ambulatory Visit | Attending: Family Medicine | Admitting: Family Medicine

## 2015-07-28 DIAGNOSIS — R942 Abnormal results of pulmonary function studies: Secondary | ICD-10-CM

## 2016-05-25 IMAGING — CR DG CHEST 2V
2 series · 2 of 2 positions shown · non-contrast
Comparison: 06/16/2008

CLINICAL DATA: Abnormal pulmonary function tests.

EXAM:
CHEST  2 VIEW

[w chest pa]
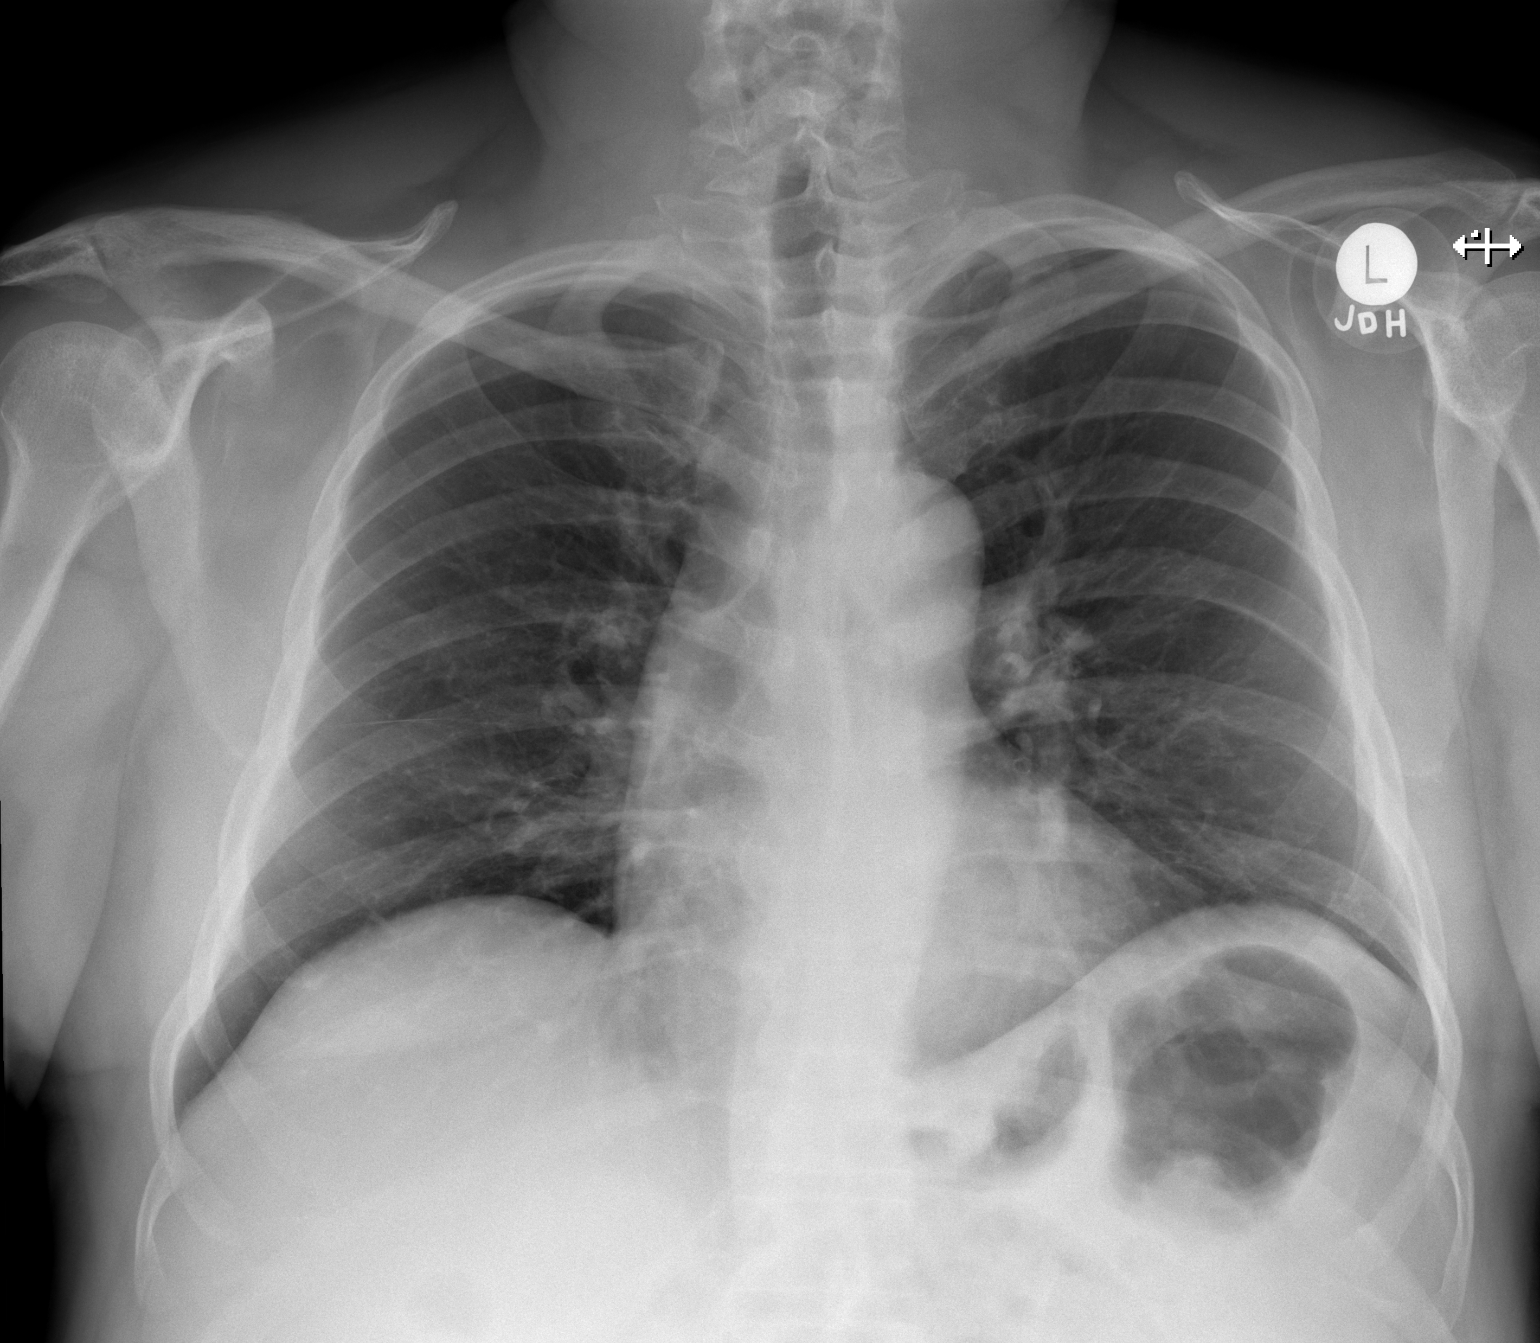

[w chest lat]
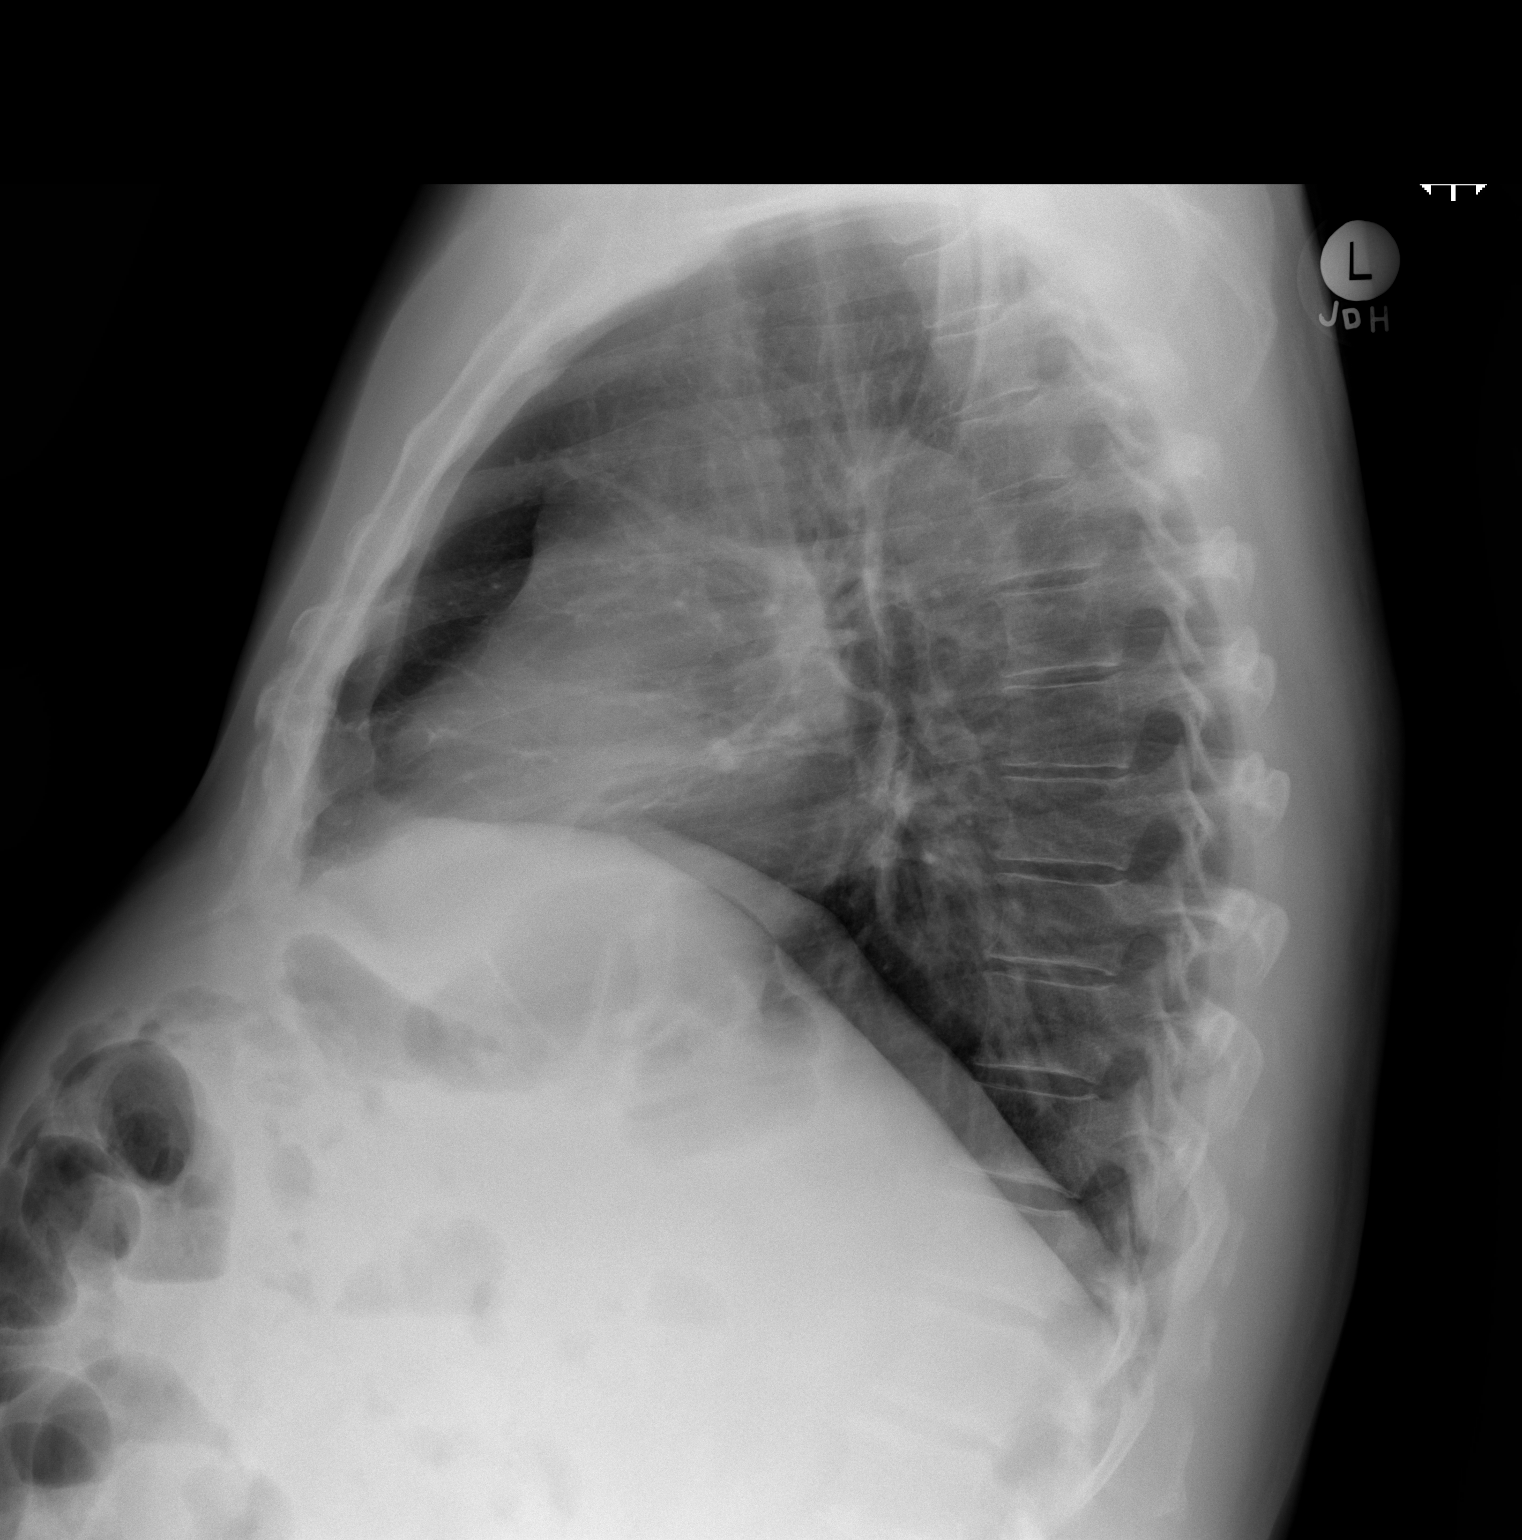

[2 of 2 positions shown; findings below may reference images not displayed]

FINDINGS: The heart size and mediastinal contours are within normal limits.
Both lungs are clear. The visualized skeletal structures are
unremarkable.
IMPRESSION: No active cardiopulmonary disease.

## 2020-10-18 ENCOUNTER — Other Ambulatory Visit: Payer: Self-pay | Admitting: Family Medicine

## 2020-10-19 LAB — SARS CORONAVIRUS 2 (TAT 6-24 HRS): SARS Coronavirus 2: POSITIVE — AB

## 2023-10-12 ENCOUNTER — Telehealth: Payer: Self-pay | Admitting: Internal Medicine

## 2023-10-12 NOTE — Telephone Encounter (Signed)
 Request to schedule screening colonoscopy received.  Please schedule previsit and direct colonoscopy for screening.

## 2023-10-17 ENCOUNTER — Other Ambulatory Visit (HOSPITAL_COMMUNITY): Payer: Self-pay | Admitting: Family Medicine

## 2023-10-17 ENCOUNTER — Ambulatory Visit (HOSPITAL_COMMUNITY)
Admission: RE | Admit: 2023-10-17 | Discharge: 2023-10-17 | Disposition: A | Discharge status: Home / Self Care | Source: Ambulatory Visit | Attending: Vascular Surgery | Admitting: Vascular Surgery

## 2023-10-17 DIAGNOSIS — R0989 Other specified symptoms and signs involving the circulatory and respiratory systems: Secondary | ICD-10-CM

## 2023-10-18 NOTE — Telephone Encounter (Signed)
 I see where Donald Hanson had a Vascular ultrasound carotid study 10/17/2023. Wanted to double check Donald Hanson that we are still okay for a direct colonoscopy. Thank you for your time.

## 2023-10-19 NOTE — Telephone Encounter (Signed)
 Carotid ultrasound reviewed patient is still appropriate for direct colonoscopy

## 2023-10-22 NOTE — Telephone Encounter (Signed)
 Eddie called back and set up appointments for August. The PCP records have been put in the pre-visit folder for rm 53.

## 2023-10-22 NOTE — Telephone Encounter (Signed)
 I left Donald Hanson a detailed voice mail message to give us  a call and set up a pre-visit and a colonoscopy appointment with Dr Lupita Commander.

## 2023-11-26 ENCOUNTER — Ambulatory Visit (AMBULATORY_SURGERY_CENTER)

## 2023-11-26 VITALS — Ht 70.0 in | Wt 192.0 lb

## 2023-11-26 DIAGNOSIS — Z1211 Encounter for screening for malignant neoplasm of colon: Secondary | ICD-10-CM

## 2023-11-26 MED ORDER — NA SULFATE-K SULFATE-MG SULF 17.5-3.13-1.6 GM/177ML PO SOLN: 1.0000 | Freq: Once | ORAL | 0 refills | Status: AC

## 2023-11-26 NOTE — Progress Notes (Signed)

## 2023-11-27 ENCOUNTER — Encounter: Payer: Self-pay | Admitting: Internal Medicine

## 2023-12-13 NOTE — Progress Notes (Unsigned)
 [facility] History and Physical   Primary Care Physician:  Windy Coy, MD   Reason for Procedure:  Colon cancer screening  Plan:    Colonoscopy     HPI: Donald Hanson is a 73 y.o. male presenting for a repeat screening colonoscopy.  He had an exam in 2015 with a redundant colon, he had a history of sigmoid volvulus and underwent a subsequent laparoscopic sigmoid colectomy.   Past Medical History:  Diagnosis Date   Cancer [facility] 2010   prostate cancer   Diabetes (HCC)    Elevated amylase and lipase 07/18/2013   History of kidney stones    Hyperlipidemia    Hypertension    Seasonal allergies    Volvulus of sigmoid colon (HCC)    hx of- hospitalized 3/6 to 3/9 2015 - decompression by colonoscopy    Past Surgical History:  Procedure Laterality Date   COLONOSCOPY N/A 06/27/2013   Procedure: COLONOSCOPY;  Surgeon: Norleen LOISE Kiang, MD;  Location: [facility] ENDOSCOPY;  Service: Endoscopy;  Laterality: N/A;   KIDNEY STONE SURGERY     LAPAROSCOPIC SIGMOID COLECTOMY N/A 08/29/2013   Procedure: LAPAROSCOPIC assisted COLECTOMY;  Surgeon: Camellia CHRISTELLA Blush, MD;  Location: [facility] ORS;  Service: General;  Laterality: N/A;   nasal fracture  1972   PROSTATECTOMY  2010     Current Outpatient Medications  Medication Sig Dispense Refill   amLODipine (NORVASC) 10 MG tablet Take 10 mg by mouth every morning.     ezetimibe (ZETIA) 10 MG tablet Take 10 mg by mouth daily.     glipiZIDE (GLUCOTROL XL) 5 MG 24 hr tablet Take 5 mg by mouth 2 (two) times daily.     LANTUS SOLOSTAR 100 UNIT/ML Solostar Pen Inject 100 Units into the skin daily.     loratadine (CLARITIN) 10 MG tablet Take 10 mg by mouth daily. (Patient not taking: Reported on 11/26/2023)     losartan  (COZAAR ) 100 MG tablet Take 100 mg by mouth every morning.      metFORMIN (GLUMETZA) 500 MG (MOD) 24 hr tablet Take 1,000 mg by mouth 2 (two) times daily with a meal.      Omega-3 Fatty Acids (FISH OIL) 1000 MG CPDR Take 1,000 mg by mouth 2  (two) times daily.      OZEMPIC, 2 MG/DOSE, 8 MG/3ML SOPN SMARTSIG:2 Milligram(s) Topical Once a Week     rosuvastatin (CRESTOR) 40 MG tablet Take 40 mg by mouth daily.     sitaGLIPtin (JANUVIA) 100 MG tablet Take 100 mg by mouth daily.     Vitamin D, Cholecalciferol, 1000 UNITS TABS Take by mouth 2 (two) times daily.     No current facility-administered medications for this visit.    Allergies as of 12/14/2023   (No Known Allergies)    Family History  Problem Relation Age of Onset   Cancer - Ovarian Mother    Heart disease Mother    Other Father    Colon cancer Neg Hx    Rectal cancer Neg Hx    Stomach cancer Neg Hx     Social History   Socioeconomic History   Marital status: Single    Spouse name: Not on file   Number of children: Not on file   Years of education: Not on file   Highest education level: Not on file  Occupational History    Employer: city of [place]    Comment: [employer]  Tobacco Use   Smoking status: Former  Current packs/day: 0.00    Types: Cigarettes    Quit date: 04/24/1968    Years since quitting: 55.6   Smokeless tobacco: Never  Substance and Sexual Activity   Alcohol use: Yes    Alcohol/week: 2.0 standard drinks of alcohol    Types: 2 Cans of beer per week    Comment: 2  BEERS A WEEK   Drug use: No   Sexual activity: Yes  Other Topics Concern   Not on file  Social History Narrative   Not on file   Social Drivers of Health   Financial Resource Strain: Not on file  Food Insecurity: Not on file  Transportation Needs: Not on file  Physical Activity: Not on file  Stress: Not on file  Social Connections: Not on file  Intimate Partner Violence: Not on file    Review of Systems: Positive for *** All other review of systems negative except as mentioned in the HPI.  Physical Exam: Vital signs There were no vitals taken for this visit.  General:   Alert,  Well-developed, well-nourished, pleasant and cooperative in  NAD Lungs:  Clear throughout to auscultation.   Heart:  Regular rate and rhythm; no murmurs, clicks, rubs,  or gallops. Abdomen:  Soft, nontender and nondistended. Normal bowel sounds.   Neuro/Psych:  Alert and cooperative. Normal mood and affect. A and O x 3   @Keiton Cosma  CHARLENA Commander, MD, [facility] [phone] (pager) 12/13/2023 8:47 PM@

## 2023-12-14 ENCOUNTER — Ambulatory Visit: Admitting: Internal Medicine

## 2023-12-14 ENCOUNTER — Encounter: Payer: Self-pay | Admitting: Internal Medicine

## 2023-12-14 VITALS — BP 131/72 | HR 76 | Temp 99.1°F | Resp 13 | Ht 70.0 in | Wt 192.0 lb

## 2023-12-14 DIAGNOSIS — D125 Benign neoplasm of sigmoid colon: Secondary | ICD-10-CM

## 2023-12-14 DIAGNOSIS — Z1211 Encounter for screening for malignant neoplasm of colon: Secondary | ICD-10-CM

## 2023-12-14 DIAGNOSIS — K5140 Inflammatory polyps of colon without complications: Secondary | ICD-10-CM | POA: Diagnosis not present

## 2023-12-14 DIAGNOSIS — D214 Benign neoplasm of connective and other soft tissue of abdomen: Secondary | ICD-10-CM

## 2023-12-14 DIAGNOSIS — Q438 Other specified congenital malformations of intestine: Secondary | ICD-10-CM

## 2023-12-14 DIAGNOSIS — D128 Benign neoplasm of rectum: Secondary | ICD-10-CM

## 2023-12-14 DIAGNOSIS — D123 Benign neoplasm of transverse colon: Secondary | ICD-10-CM

## 2023-12-14 DIAGNOSIS — K644 Residual hemorrhoidal skin tags: Secondary | ICD-10-CM

## 2023-12-14 MED ORDER — SODIUM CHLORIDE 0.9 % IV SOLN: 500.0000 mL | INTRAVENOUS | Status: DC

## 2023-12-14 NOTE — Patient Instructions (Addendum)
 I found and removed four polyps today. I will let you know pathology results and when to have another routine colonoscopy by mail and/or My Chart.  Also saw the hemorrhoids.  I appreciate the opportunity to care for you. Lupita CHARLENA Commander, MD, [facility]   Discharge instructions given. Handouts on polyps and Hemorrhoids. Resume previous medications. No aspirin,ibuprofen,naproxen, or other non-steroidal anti inflammatory drugs for 2 weeks. YOU HAD AN ENDOSCOPIC PROCEDURE TODAY AT THE [facility]:   Refer to the procedure report that was given to you for any specific questions about what was found during the examination.  If the procedure report does not answer your questions, please call your gastroenterologist to clarify.  If you requested that your care partner not be given the details of your procedure findings, then the procedure report has been included in a sealed envelope for you to review at your convenience later.  YOU SHOULD EXPECT: Some feelings of bloating in the abdomen. Passage of more gas than usual.  Walking can help get rid of the air that was put into your GI tract during the procedure and reduce the bloating. If you had a lower endoscopy (such as a colonoscopy or flexible sigmoidoscopy) you may notice spotting of blood in your stool or on the toilet paper. If you underwent a bowel prep for your procedure, you may not have a normal bowel movement for a few days.  Please Note:  You might notice some irritation and congestion in your nose or some drainage.  This is from the oxygen used during your procedure.  There is no need for concern and it should clear up in a day or so.  SYMPTOMS TO REPORT IMMEDIATELY:  Following lower endoscopy (colonoscopy or flexible sigmoidoscopy):  Excessive amounts of blood in the stool  Significant tenderness or worsening of abdominal pains  Swelling of the abdomen that is new, acute  Fever of 100F or higher   For urgent or emergent  issues, a gastroenterologist can be reached at any hour by calling [phone]. Do not use MyChart messaging for urgent concerns.    DIET:  We do recommend a small meal at first, but then you may proceed to your regular diet.  Drink plenty of fluids but you should avoid alcoholic beverages for 24 hours.  ACTIVITY:  You should plan to take it easy for the rest of today and you should NOT DRIVE or use heavy machinery until tomorrow (because of the sedation medicines used during the test).    FOLLOW UP: Our staff will call the number listed on your records the next business day following your procedure.  We will call around 7:15- 8:00 am to check on you and address any questions or concerns that you may have regarding the information given to you following your procedure. If we do not reach you, we will leave a message.     If any biopsies were taken you will be contacted by phone or by letter within the next 1-3 weeks.  Please call us  at [phone] if you have not heard about the biopsies in 3 weeks.    SIGNATURES/CONFIDENTIALITY: You and/or your care partner have signed paperwork which will be entered into your electronic medical record.  These signatures attest to the fact that that the information above on your After Visit Summary has been reviewed and is understood.  Full responsibility of the confidentiality of this discharge information lies with you and/or your care-partner.

## 2023-12-14 NOTE — Progress Notes (Signed)
 Sedate, gd SR, tolerated procedure well, VSS, report to RN

## 2023-12-14 NOTE — Op Note (Signed)
 [facility] Patient Name: Donald Hanson Procedure Date: 12/14/2023 7:49 AM MRN: [id number] Endoscopist: Lupita FORBES Commander , MD, [id number] Age: 73 Referring MD:  Date of Birth: 11/27/1950 Gender: Male Account #: [id number] Procedure:                Colonoscopy Indications:              Screening for colorectal malignant neoplasm, Last                            colonoscopy: 2015 Medicines:                Monitored Anesthesia Care Procedure:                Pre-Anesthesia Assessment:                           - Prior to the procedure, a History and Physical                            was performed, and patient medications and                            allergies were reviewed. The patient's tolerance of                            previous anesthesia was also reviewed. The risks                            and benefits of the procedure and the sedation                            options and risks were discussed with the patient.                            All questions were answered, and informed consent                            was obtained. Prior Anticoagulants: The patient has                            taken no anticoagulant or antiplatelet agents. ASA                            Grade Assessment: II - A patient with mild systemic                            disease. After reviewing the risks and benefits,                            the patient was deemed in satisfactory condition to                            undergo the procedure.  After obtaining informed consent, the colonoscope                            was passed under direct vision. Throughout the                            procedure, the patient's blood pressure, pulse, and                            oxygen saturations were monitored continuously. The                            Olympus CF-HQ190L ([id number]) Colonoscope was                            introduced through the anus and advanced to the  the                            cecum, identified by appendiceal orifice and                            ileocecal valve. The colonoscopy was performed with                            moderate difficulty due to a redundant colon and                            significant looping. Successful completion of the                            procedure was aided by applying abdominal pressure.                            The patient tolerated the procedure well. The                            quality of the bowel preparation was adequate. The                            bowel preparation used was SUPREP via split dose                            instruction. The ileocecal valve, appendiceal                            orifice, and rectum were photographed. Scope In: 8:10:01 AM Scope Out: 8:49:49 AM Scope Withdrawal Time: 0 hours 33 minutes 14 seconds  Total Procedure Duration: 0 hours 39 minutes 48 seconds  Findings:                 Hemorrhoids were found on perianal exam.                           The digital rectal exam findings include surgically  absent prostate.                           A 20 mm polyp was found in the proximal sigmoid                            colon. The polyp was sessile. The polyp was removed                            with a piecemeal technique using a cold snare.                            Resection and retrieval were complete. Verification                            of patient identification for the specimen was                            done. Estimated blood loss was minimal.                           A 7 mm polyp was found in the distal transverse                            colon. The polyp was sessile. The polyp was removed                            with a cold snare. Resection and retrieval were                            complete. Verification of patient identification                            for the specimen was done. Estimated blood loss  was                            minimal. To prevent bleeding after the polypectomy,                            two hemostatic clips were successfully placed (MR                            conditional). There was no bleeding at the end of                            the procedure.                           Two sessile polyps were found in the rectum and                            sigmoid colon. The polyps were 3 to 6 mm in size.  These polyps were removed with a cold snare.                            Resection and retrieval were complete. Verification                            of patient identification for the specimen was                            done. Estimated blood loss was minimal.                           The exam was otherwise without abnormality on                            direct and retroflexion views. Complications:            No immediate complications. Estimated Blood Loss:     Estimated blood loss was minimal. Impression:               - Hemorrhoids found on perianal exam. 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                           - One 20 mm polyp in the proximal sigmoid colon,                            removed piecemeal using a cold snare. Resected and                            retrieved.                           - One 7 mm polyp in the distal transverse colon,                            removed with a cold snare. Resected and retrieved.                            Clips x 2 (MR conditional) were placed due to                            persistent bleeding at site.                           - Two 3 to 6 mm polyps in the rectum and in the                            sigmoid colon, removed with a cold snare. Resected  and retrieved.                           - The examination was otherwise normal on direct                             and retroflexion views. Recommendation:           - Patient has a contact number available for                            emergencies. The signs and symptoms of potential                            delayed complications were discussed with the                            patient. Return to normal activities tomorrow.                            Written discharge instructions were provided to the                            patient.                           - Resume previous diet.                           - Continue present medications.                           - No aspirin, ibuprofen, naproxen, or other                            non-steroidal anti-inflammatory drugs for 2 weeks                            after polyp removal.                           - Repeat colonoscopy is recommended. The                            colonoscopy date will be determined after pathology                            results from today's exam become available for                            review. EXTRA PREP AND TRY ABDOMINAL BINDER TO HELP                            WITH REDUNDANT COLON Lupita FORBES Commander, MD 12/14/2023 9:04:21 AM This report has been signed electronically.

## 2023-12-14 NOTE — Progress Notes (Signed)
 Pt's states no medical or surgical changes since previsit or office visit.

## 2023-12-17 ENCOUNTER — Telehealth: Payer: Self-pay

## 2023-12-17 NOTE — Telephone Encounter (Signed)
  Follow up Call-     12/14/2023    7:12 AM  Call back number  Post procedure Call Back phone  # [phone]  Permission to leave phone message Yes     Patient questions:  Do you have a fever, pain , or abdominal swelling? No. Pain Score  0 *  Have you tolerated food without any problems? Yes.    Have you been able to return to your normal activities? Yes.    Do you have any questions about your discharge instructions: Diet   No. Medications  No. Follow up visit  No.  Do you have questions or concerns about your Care? No.  Actions: * If pain score is 4 or above: No action needed, pain <4.

## 2023-12-19 LAB — SURGICAL PATHOLOGY

## 2023-12-21 ENCOUNTER — Encounter: Payer: Self-pay | Admitting: Internal Medicine

## 2023-12-21 ENCOUNTER — Ambulatory Visit: Payer: Self-pay | Admitting: Internal Medicine

## 2023-12-21 DIAGNOSIS — Z860101 Personal history of adenomatous and serrated colon polyps: Secondary | ICD-10-CM | POA: Insufficient documentation

## 2024-01-18 ENCOUNTER — Encounter: Payer: Self-pay | Admitting: Internal Medicine

## 2024-04-07 ENCOUNTER — Ambulatory Visit
Admission: RE | Admit: 2024-04-07 | Discharge: 2024-04-07 | Disposition: A | Source: Ambulatory Visit | Attending: Family Medicine | Admitting: Family Medicine

## 2024-04-07 ENCOUNTER — Other Ambulatory Visit: Payer: Self-pay | Admitting: Family Medicine

## 2024-04-07 DIAGNOSIS — M25561 Pain in right knee: Secondary | ICD-10-CM

## 2024-05-05 ENCOUNTER — Encounter: Payer: Self-pay | Admitting: Orthopaedic Surgery

## 2024-05-05 ENCOUNTER — Other Ambulatory Visit: Payer: Self-pay

## 2024-05-05 ENCOUNTER — Ambulatory Visit: Admitting: Orthopaedic Surgery

## 2024-05-05 DIAGNOSIS — M25561 Pain in right knee: Secondary | ICD-10-CM

## 2024-05-05 DIAGNOSIS — L989 Disorder of the skin and subcutaneous tissue, unspecified: Secondary | ICD-10-CM

## 2024-05-05 MED ORDER — LIDOCAINE HCL 1 % IJ SOLN
3.0000 mL | INTRAMUSCULAR | Status: AC | PRN
  Administered 2024-05-05: 3 mL

## 2024-05-05 MED ORDER — METHYLPREDNISOLONE ACETATE 40 MG/ML IJ SUSP
40.0000 mg | INTRAMUSCULAR | Status: AC | PRN
  Administered 2024-05-05: 40 mg via INTRA_ARTICULAR

## 2024-05-05 NOTE — Progress Notes (Signed)
 Donald Hanson is a very pleasant and active 74 year old gentleman I am seeing for the first time as a patient but he has been with other patients of mine before.  He is a patient of Dr. Maude Sprague is sent me due to acute injury of his right knee that he sustained when he was raking leaves about a month ago.  He definitely got swelling in his knee after that he states and it caused foot and ankle swelling.  Dr. Sprague did a pain plain films of the right knee and what is concerning is his plain films show some type of sclerotic lesion in the distal femur.  There is also evidence of arthritic changes with slight varus malalignment, medial joint space narrowing and patellofemoral narrowing.  I did go over the plain film findings with the patient.  He had not had any previous knee issues and says he did injure this sliding into a base playing baseball in college but overall he is not having knee issues until this most recent injury when he was pivoting.  It does seem on exam like he may have torn a medial meniscus.  He has a positive McMurray's exam to the medial compartment knee but no significant effusion with good range of motion.  Again the plain film findings are concerning due to this large lesion in the distal femur metaphyseal area and even the radiologist is recommended a MRI given this large lesion.  There is sclerotic components but also a mixed component as well.  I did feel it was reasonable to place a steroid injection in his right knee today just to temporize the pain that he been experiencing with the knee and he understands the rationale behind us  obtaining MRI to assess this lesion in the bone as well as assess for a meniscal tear.  We will see him back in follow-up once we have the MRI.  He did tolerate steroid injection very well in the right knee.    Procedure Note  Patient: Donald Hanson             Date of Birth: [birth year]           MRN: [id number]             Visit Date:  05/05/2024  Procedures: Visit Diagnoses:  1. Acute pain of right knee     Large Joint Inj: R knee on 05/05/2024 3:34 PM Indications: diagnostic evaluation and pain Details: 22 G 1.5 in needle, superolateral approach  Arthrogram: No  Medications: 3 mL lidocaine  1 %; 40 mg methylPREDNISolone  acetate 40 MG/ML Outcome: tolerated well, no immediate complications Procedure, treatment alternatives, risks and benefits explained, specific risks discussed. Consent was given by the patient. Immediately prior to procedure a time out was called to verify the correct patient, procedure, equipment, support staff and site/side marked as required. Patient was prepped and draped in the usual sterile fashion.

## 2024-05-17 ENCOUNTER — Ambulatory Visit
Admission: RE | Admit: 2024-05-17 | Discharge: 2024-05-17 | Disposition: A | Source: Ambulatory Visit | Attending: Orthopaedic Surgery | Admitting: Orthopaedic Surgery

## 2024-05-17 DIAGNOSIS — M25561 Pain in right knee: Secondary | ICD-10-CM

## 2024-05-17 DIAGNOSIS — L989 Disorder of the skin and subcutaneous tissue, unspecified: Secondary | ICD-10-CM

## 2024-05-29 ENCOUNTER — Ambulatory Visit: Admitting: Orthopaedic Surgery

## 2024-05-29 ENCOUNTER — Encounter: Payer: Self-pay | Admitting: Orthopaedic Surgery

## 2024-05-29 DIAGNOSIS — M25561 Pain in right knee: Secondary | ICD-10-CM

## 2024-05-29 NOTE — Progress Notes (Signed)
 The patient is a 74 year old gentleman who comes in today after having an MRI of his right femur and right knee secondary to a large intraosseous lesion that was seen in the metaphyseal area of the distal femur.  We did place a steroid injection in his right knee at his last visit due to the acute pain he was having and he says now his knee pain is gone completely and he is feeling great.  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  He is completely pain-free as well.  NuPrep on exam there is no knee joint effusion and his knee moves smoothly.  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  Since the injection has done really well for his right knee pain, we will just follow him along conservatively.  If he does develop any worsening issues with that knee or distal femur he knows to let us  know.
